# Patient Record
Sex: Female | Born: 1980
Health system: Southern US, Community
[De-identification: ages and names within clinical notes are randomized; demographics above are authoritative.]

## PROBLEM LIST (undated history)

## (undated) DIAGNOSIS — K644 Residual hemorrhoidal skin tags: Secondary | ICD-10-CM

## (undated) DIAGNOSIS — E669 Obesity, unspecified: Secondary | ICD-10-CM

## (undated) HISTORY — DX: Residual hemorrhoidal skin tags: K64.4

## (undated) HISTORY — PX: TUBAL LIGATION: SHX77

## (undated) HISTORY — DX: Obesity, unspecified: E66.9

---
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
## 2002-02-10 ENCOUNTER — Inpatient Hospital Stay (HOSPITAL_COMMUNITY): Admission: AD | Admit: 2002-02-10 | Discharge: 2002-02-10 | Payer: Self-pay | Admitting: Obstetrics and Gynecology

## 2002-04-06 ENCOUNTER — Other Ambulatory Visit: Admission: RE | Admit: 2002-04-06 | Discharge: 2002-04-06 | Payer: Self-pay | Admitting: Obstetrics and Gynecology

## 2002-08-09 ENCOUNTER — Other Ambulatory Visit: Admission: RE | Admit: 2002-08-09 | Discharge: 2002-08-09 | Payer: Self-pay | Admitting: Obstetrics and Gynecology

## 2002-08-11 ENCOUNTER — Inpatient Hospital Stay (HOSPITAL_COMMUNITY): Admission: AD | Admit: 2002-08-11 | Discharge: 2002-08-11 | Payer: Self-pay | Admitting: Obstetrics and Gynecology

## 2002-08-30 ENCOUNTER — Inpatient Hospital Stay (HOSPITAL_COMMUNITY): Admission: AD | Admit: 2002-08-30 | Discharge: 2002-09-01 | Payer: Self-pay | Admitting: Obstetrics and Gynecology

## 2002-09-07 ENCOUNTER — Inpatient Hospital Stay (HOSPITAL_COMMUNITY): Admission: AD | Admit: 2002-09-07 | Discharge: 2002-09-07 | Payer: Self-pay | Admitting: Obstetrics and Gynecology

## 2002-09-13 ENCOUNTER — Other Ambulatory Visit: Admission: RE | Admit: 2002-09-13 | Discharge: 2002-09-13 | Payer: Self-pay | Admitting: Obstetrics and Gynecology

## 2002-10-24 ENCOUNTER — Other Ambulatory Visit: Admission: RE | Admit: 2002-10-24 | Discharge: 2002-10-24 | Payer: Self-pay | Admitting: Obstetrics and Gynecology

## 2002-10-25 ENCOUNTER — Emergency Department (HOSPITAL_COMMUNITY): Admission: EM | Admit: 2002-10-25 | Discharge: 2002-10-25 | Payer: Self-pay | Admitting: Emergency Medicine

## 2003-06-16 ENCOUNTER — Emergency Department (HOSPITAL_COMMUNITY): Admission: AD | Admit: 2003-06-16 | Discharge: 2003-06-16 | Payer: Self-pay | Admitting: Family Medicine

## 2003-10-05 ENCOUNTER — Emergency Department (HOSPITAL_COMMUNITY): Admission: EM | Admit: 2003-10-05 | Discharge: 2003-10-05 | Payer: Self-pay | Admitting: Emergency Medicine

## 2004-04-10 ENCOUNTER — Emergency Department (HOSPITAL_COMMUNITY): Admission: EM | Admit: 2004-04-10 | Discharge: 2004-04-10 | Payer: Self-pay | Admitting: Emergency Medicine

## 2005-01-22 ENCOUNTER — Other Ambulatory Visit: Admission: RE | Admit: 2005-01-22 | Discharge: 2005-01-22 | Payer: Self-pay | Admitting: Obstetrics and Gynecology

## 2005-04-04 ENCOUNTER — Emergency Department (HOSPITAL_COMMUNITY): Admission: AD | Admit: 2005-04-04 | Discharge: 2005-04-04 | Payer: Self-pay | Admitting: Emergency Medicine

## 2005-05-18 ENCOUNTER — Inpatient Hospital Stay (HOSPITAL_COMMUNITY): Admission: AD | Admit: 2005-05-18 | Discharge: 2005-05-19 | Payer: Self-pay | Admitting: Obstetrics and Gynecology

## 2005-10-06 ENCOUNTER — Ambulatory Visit: Payer: Self-pay | Admitting: Family Medicine

## 2005-12-02 ENCOUNTER — Ambulatory Visit: Payer: Self-pay | Admitting: Family Medicine

## 2005-12-03 ENCOUNTER — Other Ambulatory Visit: Admission: RE | Admit: 2005-12-03 | Discharge: 2005-12-03 | Payer: Self-pay | Admitting: Obstetrics and Gynecology

## 2006-02-20 ENCOUNTER — Emergency Department (HOSPITAL_COMMUNITY): Admission: EM | Admit: 2006-02-20 | Discharge: 2006-02-20 | Payer: Self-pay | Admitting: Emergency Medicine

## 2006-04-04 ENCOUNTER — Ambulatory Visit: Payer: Self-pay | Admitting: Family Medicine

## 2006-04-04 ENCOUNTER — Encounter: Admission: RE | Admit: 2006-04-04 | Discharge: 2006-04-04 | Payer: Self-pay | Admitting: Family Medicine

## 2006-06-19 ENCOUNTER — Emergency Department (HOSPITAL_COMMUNITY): Admission: EM | Admit: 2006-06-19 | Discharge: 2006-06-19 | Payer: Self-pay | Admitting: Family Medicine

## 2006-06-30 ENCOUNTER — Ambulatory Visit: Payer: Self-pay | Admitting: Family Medicine

## 2006-07-01 ENCOUNTER — Ambulatory Visit: Payer: Self-pay | Admitting: Family Medicine

## 2006-08-15 ENCOUNTER — Ambulatory Visit: Payer: Self-pay | Admitting: Family Medicine

## 2006-08-19 ENCOUNTER — Ambulatory Visit: Payer: Self-pay | Admitting: Family Medicine

## 2007-02-15 ENCOUNTER — Ambulatory Visit: Payer: Self-pay | Admitting: Family Medicine

## 2007-04-20 ENCOUNTER — Emergency Department (HOSPITAL_COMMUNITY): Admission: EM | Admit: 2007-04-20 | Discharge: 2007-04-20 | Payer: Self-pay | Admitting: Family Medicine

## 2007-08-17 ENCOUNTER — Emergency Department (HOSPITAL_COMMUNITY): Admission: EM | Admit: 2007-08-17 | Discharge: 2007-08-17 | Payer: Self-pay | Admitting: Emergency Medicine

## 2007-11-21 ENCOUNTER — Ambulatory Visit: Payer: Self-pay | Admitting: Family Medicine

## 2008-01-28 ENCOUNTER — Emergency Department (HOSPITAL_COMMUNITY): Admission: EM | Admit: 2008-01-28 | Discharge: 2008-01-28 | Payer: Self-pay | Admitting: Emergency Medicine

## 2008-07-10 ENCOUNTER — Ambulatory Visit: Payer: Self-pay | Admitting: Family Medicine

## 2008-08-19 ENCOUNTER — Ambulatory Visit: Payer: Self-pay | Admitting: Family Medicine

## 2008-10-22 ENCOUNTER — Inpatient Hospital Stay (HOSPITAL_COMMUNITY): Admission: AD | Admit: 2008-10-22 | Discharge: 2008-10-22 | Payer: Self-pay | Admitting: Obstetrics and Gynecology

## 2009-02-13 ENCOUNTER — Inpatient Hospital Stay (HOSPITAL_COMMUNITY): Admission: AD | Admit: 2009-02-13 | Discharge: 2009-02-14 | Payer: Self-pay | Admitting: Obstetrics and Gynecology

## 2009-02-26 ENCOUNTER — Inpatient Hospital Stay (HOSPITAL_COMMUNITY): Admission: AD | Admit: 2009-02-26 | Discharge: 2009-02-26 | Payer: Self-pay | Admitting: Obstetrics & Gynecology

## 2009-02-27 ENCOUNTER — Inpatient Hospital Stay (HOSPITAL_COMMUNITY): Admission: AD | Admit: 2009-02-27 | Discharge: 2009-02-27 | Payer: Self-pay | Admitting: Obstetrics and Gynecology

## 2009-04-09 ENCOUNTER — Inpatient Hospital Stay (HOSPITAL_COMMUNITY): Admission: AD | Admit: 2009-04-09 | Discharge: 2009-04-09 | Payer: Self-pay | Admitting: Obstetrics and Gynecology

## 2009-04-21 ENCOUNTER — Inpatient Hospital Stay (HOSPITAL_COMMUNITY): Admission: AD | Admit: 2009-04-21 | Discharge: 2009-04-21 | Payer: Self-pay | Admitting: Obstetrics and Gynecology

## 2009-05-01 ENCOUNTER — Inpatient Hospital Stay (HOSPITAL_COMMUNITY): Admission: AD | Admit: 2009-05-01 | Discharge: 2009-05-02 | Payer: Self-pay | Admitting: Obstetrics and Gynecology

## 2009-05-02 ENCOUNTER — Encounter (INDEPENDENT_AMBULATORY_CARE_PROVIDER_SITE_OTHER): Payer: Self-pay | Admitting: Obstetrics and Gynecology

## 2009-05-02 ENCOUNTER — Inpatient Hospital Stay (HOSPITAL_COMMUNITY): Admission: AD | Admit: 2009-05-02 | Discharge: 2009-05-04 | Payer: Self-pay | Admitting: Obstetrics and Gynecology

## 2009-05-03 ENCOUNTER — Encounter (INDEPENDENT_AMBULATORY_CARE_PROVIDER_SITE_OTHER): Payer: Self-pay | Admitting: Obstetrics and Gynecology

## 2010-06-30 ENCOUNTER — Encounter: Payer: Self-pay | Admitting: Family Medicine

## 2010-07-19 LAB — CBC
HCT: 31 % — ABNORMAL LOW (ref 36.0–46.0)
Hemoglobin: 10.6 g/dL — ABNORMAL LOW (ref 12.0–15.0)
MCHC: 33.6 g/dL (ref 30.0–36.0)
Platelets: 203 10*3/uL (ref 150–400)
Platelets: 207 10*3/uL (ref 150–400)
RBC: 3.46 MIL/uL — ABNORMAL LOW (ref 3.87–5.11)
RDW: 13.4 % (ref 11.5–15.5)
RDW: 13.8 % (ref 11.5–15.5)
WBC: 15.7 10*3/uL — ABNORMAL HIGH (ref 4.0–10.5)

## 2010-07-31 ENCOUNTER — Encounter (INDEPENDENT_AMBULATORY_CARE_PROVIDER_SITE_OTHER): Payer: BC Managed Care – PPO | Admitting: Family Medicine

## 2010-07-31 DIAGNOSIS — R51 Headache: Secondary | ICD-10-CM

## 2010-07-31 DIAGNOSIS — Z0000 Encounter for general adult medical examination without abnormal findings: Secondary | ICD-10-CM

## 2010-07-31 DIAGNOSIS — J309 Allergic rhinitis, unspecified: Secondary | ICD-10-CM

## 2010-08-03 LAB — RPR: RPR Ser Ql: NONREACTIVE

## 2010-08-03 LAB — CBC
HCT: 37.2 % (ref 36.0–46.0)
MCHC: 33.3 g/dL (ref 30.0–36.0)
Platelets: 225 10*3/uL (ref 150–400)
RBC: 4.13 MIL/uL (ref 3.87–5.11)

## 2010-08-03 LAB — WET PREP, GENITAL
Trich, Wet Prep: NONE SEEN
Yeast Wet Prep HPF POC: NONE SEEN

## 2010-08-03 LAB — GC/CHLAMYDIA PROBE AMP, GENITAL
Chlamydia, DNA Probe: NEGATIVE
GC Probe Amp, Genital: NEGATIVE

## 2010-08-04 LAB — URINE MICROSCOPIC-ADD ON

## 2010-08-04 LAB — URINALYSIS, ROUTINE W REFLEX MICROSCOPIC
Hgb urine dipstick: NEGATIVE
Nitrite: NEGATIVE
Protein, ur: NEGATIVE mg/dL
Specific Gravity, Urine: 1.01 (ref 1.005–1.030)
Urobilinogen, UA: 0.2 mg/dL (ref 0.0–1.0)

## 2010-08-04 LAB — STREP B DNA PROBE

## 2010-08-04 LAB — URINE CULTURE: Colony Count: 50000

## 2010-08-06 LAB — URINE CULTURE

## 2010-08-06 LAB — URINALYSIS, ROUTINE W REFLEX MICROSCOPIC
Bilirubin Urine: NEGATIVE
Hgb urine dipstick: NEGATIVE
Ketones, ur: 15 mg/dL — AB
Nitrite: NEGATIVE
Protein, ur: NEGATIVE mg/dL
Specific Gravity, Urine: 1.015 (ref 1.005–1.030)
Urobilinogen, UA: 0.2 mg/dL (ref 0.0–1.0)

## 2010-08-06 LAB — URINE MICROSCOPIC-ADD ON

## 2010-08-06 LAB — WET PREP, GENITAL: Trich, Wet Prep: NONE SEEN

## 2010-08-06 LAB — GC/CHLAMYDIA PROBE AMP, GENITAL
Chlamydia, DNA Probe: NEGATIVE
GC Probe Amp, Genital: NEGATIVE

## 2010-08-10 LAB — URINE CULTURE: Colony Count: NO GROWTH

## 2010-08-10 LAB — URINALYSIS, ROUTINE W REFLEX MICROSCOPIC
Bilirubin Urine: NEGATIVE
Glucose, UA: NEGATIVE mg/dL
Ketones, ur: NEGATIVE mg/dL
Urobilinogen, UA: 0.2 mg/dL (ref 0.0–1.0)

## 2010-08-10 LAB — WET PREP, GENITAL
Trich, Wet Prep: NONE SEEN
Yeast Wet Prep HPF POC: NONE SEEN

## 2010-08-10 LAB — GC/CHLAMYDIA PROBE AMP, GENITAL: Chlamydia, DNA Probe: NEGATIVE

## 2010-08-10 LAB — URINE MICROSCOPIC-ADD ON

## 2010-09-18 NOTE — H&P (Signed)
NAME:  Denise Pitts, Denise Pitts                         ACCOUNT NO.:  [id number]   MEDICAL RECORD NO.:  [id number]                   PATIENT TYPE:  MAT   LOCATION:  [facility]                                 FACILITY:  [facility]   PHYSICIAN:  Crist Fat. Rivard, M.D.              DATE OF BIRTH:  November 16, 1980   DATE OF ADMISSION:  08/30/2002  DATE OF DISCHARGE:                                HISTORY & PHYSICAL   REASON FOR ADMISSION:  Intrauterine pregnancy at 36 weeks with spontaneous  rupture of membranes.   HISTORY OF PRESENT ILLNESS:  This is a 30 year old, single, black female,  gravida 2, para 1, with an estimated date of delivery of Sep 27, 2002,  currently at 36 weeks, who experienced spontaneous rupture of membranes at  11:30 a.m. this morning, clear abundant fluid.  She is not perceiving any  contractions at this point.  Reports good fetal activity.  Denies any  symptoms of pregnancy-induced hypertension.  Denies any vaginal bleeding.   Her prenatal course at [facility] revealed blood work with  blood A positive, sickle cell trait negative, RPR nonreactive, rubella  immune, HBSAG negative, and HIV nonreactive.  Pap smear was abnormal prior  to entering prenatal care for which the patient had a colposcopy in December  of 2003, revealing a normal Pap and an equivocal HPV.  No biopsies were  taken.  The patient is scheduled for a postpartum colposcopy.  Gonorrhea was  negative.  Chlamydia was negative.  The second trimester AFP and free beta  hCG was within normal limits.  The one-hour glucose tolerance test was at  99.  Sonogram in the second trimester was confirming dates with a normal  anatomy and a normal placenta.   ALLERGIES:  No known drug allergies.   PAST MEDICAL HISTORY:  In May of 2003, spontaneous vaginal delivery of a  little girl at 37 weeks without complications.   SOCIAL HISTORY:  Single.  Nonsmoker.  She is a Consulting civil engineer.   PHYSICAL EXAMINATION:  VITAL SIGNS:   Normal.  HEENT:  Negative.  LUNGS:  Clear.  HEART:  Normal.  ABDOMEN:  Gravida and nontender.  Fundal height appropriate for dates.  Sonogram revealing vertex presentation.  PELVIC:  3 cm, 50% effaced, vertex -1.  Clear abundant fluid.  EXTREMITIES:  Negative.   ASSESSMENT:  Intrauterine pregnancy at 36 weeks with spontaneous rupture of  membranes and group B Streptococcus positive on her 35-week visit.   PLAN:  The patient will be admitted.  We will start penicillin protocol, as  well as Pitocin augmentation.  Spontaneous vaginal delivery is expected.                                              Crist Fat Rivard, M.D.   SAR/MEDQ  D:  08/30/2002  T:  08/30/2002  Job:  [id number]
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
## 2010-09-21 ENCOUNTER — Inpatient Hospital Stay (INDEPENDENT_AMBULATORY_CARE_PROVIDER_SITE_OTHER)
Admission: RE | Admit: 2010-09-21 | Discharge: 2010-09-21 | Disposition: A | Discharge status: Home / Self Care | Payer: BC Managed Care – PPO | Source: Ambulatory Visit | Attending: Emergency Medicine | Admitting: Emergency Medicine

## 2010-09-21 DIAGNOSIS — L259 Unspecified contact dermatitis, unspecified cause: Secondary | ICD-10-CM

## 2010-10-01 ENCOUNTER — Ambulatory Visit (INDEPENDENT_AMBULATORY_CARE_PROVIDER_SITE_OTHER): Payer: BC Managed Care – PPO | Admitting: Medical

## 2010-10-01 ENCOUNTER — Encounter: Payer: Self-pay | Admitting: Medical

## 2010-10-01 VITALS — BP 110/64 | HR 62 | Temp 98.4°F | Ht 62.0 in | Wt 168.5 lb

## 2010-10-01 DIAGNOSIS — K644 Residual hemorrhoidal skin tags: Secondary | ICD-10-CM

## 2010-10-01 MED ORDER — HYDROCORTISONE ACETATE 25 MG RE SUPP: 25.0000 mg | Freq: Two times a day (BID) | RECTAL | Status: AC

## 2010-10-01 NOTE — Progress Notes (Signed)
Subjective:     Denise Pitts is a 30 y.o. female who presents for evaluation of possible hemorrhoids. Onset of symptoms was gradual. Symptoms have been gradually worsening since that time. Symptoms include: anorectal itching and painful defecation. Patient denies blood in stool . Treatment to date has been sitz baths. She does note 2 prior episodes of external hemorrhoids, once when she was pregnant, one other time as well. Both times the hemorrhoid cleared up with Anusol suppositories. She denies constipation, has a bowel movement every other day since she was a teenager, no other complaint.  The following portions of the patient's history were reviewed and updated as appropriate: allergies, current medications, past family history, past medical history, past social history, past surgical history and problem list.  Review of Systems A comprehensive review of systems was negative.    Objective:    BP 110/64  Pulse 62  Temp(Src) 98.4 F (36.9 C) (Oral)  Ht 5\' 2"  (1.575 m)  Wt 168 lb 8 oz (76.431 kg)  BMI 30.82 kg/m2  LMP 09/29/2010  Gen.: Well-developed, well-nourished, African American female, overweight Abdomen: Positive bowel sounds, nontender, nondistended, no hepatosplenomegaly, no masses Rectum: 1 large right external hemorrhoid, mildly tender, non-thrombosed otherwise anus normal appearing, no redness or fluctuance, exam chaperoned by nurse   Assessment:    External hemorrhoid   Plan:    Discussed the diagnosis with the patient, including plans for treatment and expected course. Tour manager. Discussed symptomatic and preventative measures regarding hemorrhoidal disease. Recommended fiber supplementation. Anusol suppositories as needed.  Short term use only. Follow up as needed.

## 2010-10-01 NOTE — Patient Instructions (Addendum)
Hemorrhoids Hemorrhoids are dilated (enlarged) veins around the rectum. Sometimes clots will form in the veins. This makes them swollen and painful. These are called thrombosed hemorrhoids. Causes of hemorrhoids include:  Pregnancy: this increases the pressure in the hemorrhoidal veins.   Constipation.   Straining to have a bowel movement.  HOME CARE INSTRUCTIONS  Eat a well balanced diet and drink 6 to 8 glasses of water every day to avoid constipation. You may also use a bulk laxative.   Increase your fiber intake.  Consider taking Fibercon or Miralax powder supplement OTC daily.  Avoid straining to have bowel movements.   Keep anal area dry and clean.   Only take over-the-counter or prescription medicines for pain, discomfort, or fever as directed by your caregiver.  If thrombosed:  Take hot sitz baths for 20 to 30 minutes, 3 to 4 times per day.   If the hemorrhoids are very tender and swollen, place ice packs on area as tolerated. Using ice packs between sitz baths may be helpful. Fill a plastic bag with ice and use a towel between the bag of ice and your skin.   Special creams and suppositories (Anusol, Nupercainal, Wyanoids) may be used or applied as directed.   Do not use a donut shaped pillow or sit on the toilet for long periods. This increases blood pooling and pain.   Move your bowels when your body has the urge; this will require less straining and will decrease pain and pressure.   Only take over-the-counter or prescription medicines for pain, discomfort, or fever as directed by your caregiver.  SEEK MEDICAL CARE IF:  You have increasing pain and swelling that is not controlled with your prescription.   You have uncontrolled bleeding.   You have an inability or difficulty having a bowel movement.   You have pain or inflammation outside the area of the hemorrhoids.   You have chills and/or an oral temperature above 101 that lasts for 2 days or longer, or as your  caregiver suggests.  MAKE SURE YOU:   Understand these instructions.   Will watch your condition.   Will get help right away if you are not doing well or get worse.  Document Released: 04/16/2000 Document Re-Released: 04/01/2008 [facility] Patient Information 2011 [place], [place].

## 2010-11-19 ENCOUNTER — Encounter: Payer: Self-pay | Admitting: Family Medicine

## 2011-01-26 LAB — RAPID STREP SCREEN (MED CTR MEBANE ONLY): Streptococcus, Group A Screen (Direct): POSITIVE — AB

## 2011-09-23 ENCOUNTER — Telehealth: Payer: Self-pay | Admitting: Obstetrics and Gynecology

## 2011-09-23 NOTE — Telephone Encounter (Signed)
Tc TO PT.  STates is having milky vag D/C.  No odor.  No itching.   Used Boric Acid Supp x 12 weeks with improvement.  D/C'd.  When sx recurred again used but no improvement.   Sched for eval w/SL 09/24/11.

## 2011-09-23 NOTE — Telephone Encounter (Signed)
Triage/epic 

## 2011-09-24 ENCOUNTER — Ambulatory Visit (INDEPENDENT_AMBULATORY_CARE_PROVIDER_SITE_OTHER): Payer: BC Managed Care – PPO | Admitting: Obstetrics and Gynecology

## 2011-09-24 ENCOUNTER — Encounter: Payer: Self-pay | Admitting: Obstetrics and Gynecology

## 2011-09-24 VITALS — BP 100/62 | Resp 16 | Wt 180.0 lb

## 2011-09-24 DIAGNOSIS — B9689 Other specified bacterial agents as the cause of diseases classified elsewhere: Secondary | ICD-10-CM

## 2011-09-24 DIAGNOSIS — A499 Bacterial infection, unspecified: Secondary | ICD-10-CM

## 2011-09-24 DIAGNOSIS — N760 Acute vaginitis: Secondary | ICD-10-CM

## 2011-09-24 MED ORDER — FLORAJEN3 PO CAPS: 1.0000 | ORAL_CAPSULE | Freq: Every day | ORAL | Status: DC

## 2011-09-24 MED ORDER — METRONIDAZOLE 500 MG PO TABS: 500.0000 mg | ORAL_TABLET | Freq: Two times a day (BID) | ORAL | Status: AC

## 2011-09-24 NOTE — Progress Notes (Signed)
Vaginal Discharge/Discomfort/Itching  Subjective:   Denise Pitts is an 31 y.o. woman who presents c/o vaginal discharge, white thick x1wk  Objective: discharge white non-adherent, lesions none, CMT neg, tenderness neg and masses none Vaginal discharge: color white, consistency thick mucoid, odor KOH-whiff test positive, wet mount clue cells Vaginal lesions:  none   Assessment: BV   Plan: Additional Tests:none indicated  Medications: flagyl 500mg  BID x7days, pt requested PO tx Rec probiotic, RX floragen,  Rec restarting boric acid, BID x7days then 2x/weekly  Follow-up: prn  Sharlie Shreffler M CNM 09/24/2011 5:25 PM

## 2011-09-24 NOTE — Progress Notes (Signed)
Vaginal discharge: whitethick mucoid Itching / Burning: no Fever: no  Symptoms have been present for 1 week. Has used over-the-counter treatment: no Associated symptoms:  Pelvic pain: no       Dyspareunia: no     Odor:  yes  History of STD:  no history of PID, STD's STD screen:declined  Pt states she has recurrent BV before her menses  Pt has Boric Acid at home stopped taking it 2 wks ago Needs Vit D refill

## 2012-12-31 ENCOUNTER — Encounter (HOSPITAL_COMMUNITY): Payer: Self-pay | Admitting: *Deleted

## 2012-12-31 ENCOUNTER — Inpatient Hospital Stay (HOSPITAL_COMMUNITY)
Admission: AD | Admit: 2012-12-31 | Discharge: 2012-12-31 | Disposition: A | Discharge status: Home / Self Care | Payer: No Typology Code available for payment source | Source: Ambulatory Visit | Attending: Obstetrics and Gynecology | Admitting: Obstetrics and Gynecology

## 2012-12-31 DIAGNOSIS — N949 Unspecified condition associated with female genital organs and menstrual cycle: Secondary | ICD-10-CM | POA: Insufficient documentation

## 2012-12-31 DIAGNOSIS — N760 Acute vaginitis: Secondary | ICD-10-CM | POA: Insufficient documentation

## 2012-12-31 DIAGNOSIS — B9689 Other specified bacterial agents as the cause of diseases classified elsewhere: Secondary | ICD-10-CM | POA: Insufficient documentation

## 2012-12-31 DIAGNOSIS — A499 Bacterial infection, unspecified: Secondary | ICD-10-CM | POA: Insufficient documentation

## 2012-12-31 LAB — URINALYSIS, ROUTINE W REFLEX MICROSCOPIC
Bilirubin Urine: NEGATIVE
Hgb urine dipstick: NEGATIVE
Nitrite: NEGATIVE
Protein, ur: NEGATIVE mg/dL
Urobilinogen, UA: 0.2 mg/dL (ref 0.0–1.0)

## 2012-12-31 LAB — URINE MICROSCOPIC-ADD ON

## 2012-12-31 LAB — WET PREP, GENITAL
Trich, Wet Prep: NONE SEEN
Yeast Wet Prep HPF POC: NONE SEEN

## 2012-12-31 MED ORDER — METRONIDAZOLE 500 MG PO TABS: 500.0000 mg | ORAL_TABLET | Freq: Two times a day (BID) | ORAL | Status: AC

## 2012-12-31 NOTE — MAU Provider Note (Signed)
History   32 yo [id number] presented unannounced c/o vaginal d/c and irritation x 3 days--started after using new soap.  Also reports hx of recurrent BV, sometimes occurring prior to cycle and with IC.  Denies STD risk, but agreeable to [facility], chlamydia testing.  Denies pregnancy risk--hx tubal ligation  Problem list: Hx recurrent BV Hx BTL 2010 Hx previous PTD   Chief Complaint  Patient presents with  . Vaginal Itching    OB History   Grav Para Term Preterm Abortions TAB SAB Ect Mult Living   4 3 3  1     3       Past Medical History  Diagnosis Date  . External hemorrhoid   . Obesity     Past Surgical History  Procedure Laterality Date  . Tubal ligation      History reviewed. No pertinent family history.  History  Substance Use Topics  . Smoking status: Never Smoker   . Smokeless tobacco: Never Used  . Alcohol Use: No     Comment: 2 drinks in a month    Allergies: No Known Allergies  Prescriptions prior to admission  Medication Sig Dispense Refill  . miconazole (MICOTIN) 2 % cream Apply topically 2 (two) times daily.      . predniSONE (DELTASONE) 10 MG tablet Take 10 mg by mouth daily.        . Probiotic Product [facility]) CAPS Take 1 capsule by mouth daily.  30 capsule  11     Physical Exam   Blood pressure 114/81, pulse 65, temperature 98.2 F (36.8 C), temperature source Oral, resp. rate 18, height 5\' 2"  (1.575 m), weight 79.833 kg (176 lb), last menstrual period 11/03/2012.  Chest clear Heart RRR without murmur Abd soft, NT Pelvic--thin yellow d/c in vault, no CMT.  Uterus NT, adnexa WNL, no masses No lesion or erythema noted in vagina or on vulva. Ext WNL  ED Course  Probable BV  Plan: UPT Wet prep GC, chlamydia with consent   Laina Guerrieri CNM, [place] 12/31/2012 7:00 AM  Addendum:  Results for orders placed during the hospital encounter of 12/31/12 (from the past 24 hour(s))  URINALYSIS, ROUTINE W REFLEX MICROSCOPIC     Status: Abnormal   Collection Time    12/31/12  5:51 AM      Result Value Range   Color, Urine YELLOW  YELLOW   APPearance CLEAR  CLEAR   Specific Gravity, Urine >1.030 (*) 1.005 - 1.030   pH 6.0  5.0 - 8.0   Glucose, UA NEGATIVE  NEGATIVE mg/dL   Hgb urine dipstick NEGATIVE  NEGATIVE   Bilirubin Urine NEGATIVE  NEGATIVE   Ketones, ur NEGATIVE  NEGATIVE mg/dL   Protein, ur NEGATIVE  NEGATIVE mg/dL   Urobilinogen, UA 0.2  0.0 - 1.0 mg/dL   Nitrite NEGATIVE  NEGATIVE   Leukocytes, UA MODERATE (*) NEGATIVE  URINE MICROSCOPIC-ADD ON     Status: Abnormal   Collection Time    12/31/12  5:51 AM      Result Value Range   Squamous Epithelial / LPF FEW (*) RARE   WBC, UA 3-6  <3 WBC/hpf   Urine-Other MUCOUS PRESENT    POCT PREGNANCY, URINE     Status: None   Collection Time    12/31/12  6:03 AM      Result Value Range   Preg Test, Ur NEGATIVE  NEGATIVE  WET PREP, GENITAL     Status: Abnormal   Collection Time  12/31/12  6:44 AM      Result Value Range   Yeast Wet Prep HPF POC NONE SEEN  NONE SEEN   Trich, Wet Prep NONE SEEN  NONE SEEN   Clue Cells Wet Prep HPF POC MODERATE (*) NONE SEEN   WBC, Wet Prep HPF POC MANY (*) NONE SEEN   Impression: BV  Plan: D/C home Rx Metronidazole 500 mg po BID x 7 days. F/U with [facility] prn.  Nigel Bridgeman, CNM 12/31/12 8a

## 2012-12-31 NOTE — MAU Note (Signed)
Patient states she started using some new soap and it has caused vaginal irritation x 2 days ago.

## 2013-01-01 LAB — URINE CULTURE

## 2013-07-26 ENCOUNTER — Ambulatory Visit
Admission: RE | Admit: 2013-07-26 | Discharge: 2013-07-26 | Disposition: A | Discharge status: Home / Self Care | Payer: BC Managed Care – PPO | Source: Ambulatory Visit | Attending: Internal Medicine | Admitting: Internal Medicine

## 2013-07-26 ENCOUNTER — Other Ambulatory Visit: Payer: Self-pay | Admitting: Internal Medicine

## 2013-07-26 DIAGNOSIS — R609 Edema, unspecified: Secondary | ICD-10-CM

## 2014-03-04 ENCOUNTER — Encounter (HOSPITAL_COMMUNITY): Payer: Self-pay | Admitting: *Deleted

## 2015-07-03 ENCOUNTER — Encounter (HOSPITAL_COMMUNITY): Payer: Self-pay | Admitting: *Deleted

## 2015-07-03 ENCOUNTER — Inpatient Hospital Stay (HOSPITAL_COMMUNITY)
Admission: AD | Admit: 2015-07-03 | Discharge: 2015-07-03 | Disposition: A | Discharge status: Home / Self Care | Payer: BLUE CROSS/BLUE SHIELD | Source: Ambulatory Visit | Attending: Obstetrics and Gynecology | Admitting: Obstetrics and Gynecology

## 2015-07-03 DIAGNOSIS — R109 Unspecified abdominal pain: Secondary | ICD-10-CM | POA: Insufficient documentation

## 2015-07-03 DIAGNOSIS — N760 Acute vaginitis: Secondary | ICD-10-CM | POA: Diagnosis not present

## 2015-07-03 DIAGNOSIS — Z3202 Encounter for pregnancy test, result negative: Secondary | ICD-10-CM | POA: Insufficient documentation

## 2015-07-03 DIAGNOSIS — R1030 Lower abdominal pain, unspecified: Secondary | ICD-10-CM

## 2015-07-03 LAB — URINALYSIS, ROUTINE W REFLEX MICROSCOPIC
BILIRUBIN URINE: NEGATIVE
Glucose, UA: NEGATIVE mg/dL
Hgb urine dipstick: NEGATIVE
Ketones, ur: NEGATIVE mg/dL
Leukocytes, UA: NEGATIVE
NITRITE: NEGATIVE
PH: 6 (ref 5.0–8.0)
Protein, ur: NEGATIVE mg/dL

## 2015-07-03 LAB — WET PREP, GENITAL
SPERM: NONE SEEN
Trich, Wet Prep: NONE SEEN
YEAST WET PREP: NONE SEEN

## 2015-07-03 LAB — POCT PREGNANCY, URINE: Preg Test, Ur: NEGATIVE

## 2015-07-03 MED ORDER — METRONIDAZOLE 500 MG PO TABS: 500.0000 mg | ORAL_TABLET | Freq: Two times a day (BID) | ORAL | Status: DC

## 2015-07-03 NOTE — MAU Note (Signed)
PT  SAYS  HAS  LEFT   LOWER ABD  CRAMPING-  STARTED   ON Tuesday-   TOOK IBUPROFEN  2 TABS -  LAST TIME AT 12NOON TODAY.    BTL-  2010.   LAST SEX-  6 MTHS.     NO HPT.    NO FEVER.    NO V/D.  NO DIFF VOIDING  LAST SEEN AT [facility]  IN 08-2014.

## 2015-07-03 NOTE — MAU Provider Note (Signed)
Denise Pitts is a 35 y.o. G4P3 non pregnant w/BTL presented unannounced c/o lower abd cramping   History     There are no active problems to display for this patient.   Chief Complaint  Patient presents with  . Abdominal Pain   HPI  OB History    Gravida Para Term Preterm AB TAB SAB Ectopic Multiple Living   4 3 0 Past Medical History  Diagnosis Date  . External hemorrhoid   . Obesity     Past Surgical History  Procedure Laterality Date  . Tubal ligation      No family history on file.  Social History  Substance Use Topics  . Smoking status: Never Smoker   . Smokeless tobacco: Never Used  . Alcohol Use: No     Comment: 2 drinks in a month    Allergies: No Known Allergies  Prescriptions prior to admission  Medication Sig Dispense Refill Last Dose  . ibuprofen (ADVIL,MOTRIN) 200 MG tablet Take 400 mg by mouth every 6 (six) hours as needed for moderate pain.   07/03/2015 at Unknown time    ROS See HPI above, all other systems are negative  Physical Exam   Blood pressure 122/74, pulse 59, temperature 98.2 F (36.8 C), temperature source Oral, height  (1.575 m), weight 174 lb 2 oz (78.983 kg), last menstrual period 04/25/2015.  Physical Exam Ext:  WNL ABD: Soft, non tender to palpation, no rebound or guarding SVE:    ED Course  Assessment: Lower abd pain   Results for orders placed or performed during the hospital encounter of 07/03/15 (from the past 24 hour(s))  Urinalysis, Routine w reflex microscopic (not at [facility])     Status: Abnormal   Collection Time: 07/03/15  7:42 PM  Result Value Ref Range   Color, Urine YELLOW YELLOW   APPearance CLEAR CLEAR   Specific Gravity, Urine >1.030 (H) 1.005 - 1.030   pH 6.0 5.0 - 8.0   Glucose, UA NEGATIVE NEGATIVE mg/dL   Hgb urine dipstick NEGATIVE NEGATIVE   Bilirubin Urine NEGATIVE NEGATIVE   Ketones, ur NEGATIVE NEGATIVE mg/dL   Protein, ur NEGATIVE NEGATIVE mg/dL   Nitrite  NEGATIVE NEGATIVE   Leukocytes, UA NEGATIVE NEGATIVE  Pregnancy, urine POC     Status: None   Collection Time: 07/03/15  8:15 PM  Result Value Ref Range   Preg Test, Ur NEGATIVE NEGATIVE  Wet prep, genital     Status: Abnormal   Collection Time: 07/03/15  8:43 PM  Result Value Ref Range   Yeast Wet Prep HPF POC NONE SEEN NONE SEEN   Trich, Wet Prep NONE SEEN NONE SEEN   Clue Cells Wet Prep HPF POC PRESENT (A) NONE SEEN   WBC, Wet Prep HPF POC MODERATE (A) NONE SEEN   Sperm NONE SEEN    BV positive  Plan: -Rx for flagyl  -Discussed need to follow up in office  -Encouraged to call if any questions or concerns arise prior to next scheduled office visit.  -Discharged to home in stable condition    Naraya Stoneberg, CNM, MSN 07/03/2015. 9:49 PM

## 2015-07-03 NOTE — Discharge Instructions (Signed)

## 2015-07-04 LAB — GC/CHLAMYDIA PROBE AMP (~~LOC~~) NOT AT ARMC
CHLAMYDIA, DNA PROBE: NEGATIVE
NEISSERIA GONORRHEA: NEGATIVE

## 2016-07-16 ENCOUNTER — Ambulatory Visit (INDEPENDENT_AMBULATORY_CARE_PROVIDER_SITE_OTHER): Payer: BLUE CROSS/BLUE SHIELD | Admitting: Podiatry

## 2016-07-16 ENCOUNTER — Encounter: Payer: Self-pay | Admitting: Podiatry

## 2016-07-16 VITALS — Resp 16 | Ht 62.0 in | Wt 175.0 lb

## 2016-07-16 DIAGNOSIS — L309 Dermatitis, unspecified: Secondary | ICD-10-CM

## 2016-07-16 DIAGNOSIS — B351 Tinea unguium: Secondary | ICD-10-CM | POA: Diagnosis not present

## 2016-07-16 MED ORDER — TERBINAFINE HCL 250 MG PO TABS: 250.0000 mg | ORAL_TABLET | Freq: Every day | ORAL | 0 refills | Status: DC

## 2016-07-16 NOTE — Progress Notes (Signed)
   Subjective:    Patient ID: Denise Pitts, female    DOB: 05-06-1980, 36 y.o.   MRN: [id number]  HPI  Chief Complaint  Patient presents with  . Tinea Pedis    BL; Itchy dry skin between toes and bottom of foot. Right is worse than left. Pt has tried athletes foot creams and sprays, not effective.   . Skin Lesion    Right 5th toe; latral side. Thick dry skin. Denies pain.   . Nail Problem    Right; Great toe and 5th toe; Dark/Discolored.        Review of Systems     Objective:   Physical Exam        Assessment & Plan:

## 2016-07-18 NOTE — Progress Notes (Signed)
Subjective:     Patient ID: Chaney Bornequila S Scinto, female   DOB: [birth year], 36 y.o.   MRN: [id number]  HPI patient presents with excessively dry skin of the right over left foot with irritation and white between the lesser digits right foot. History of itching   Review of Systems  All other systems reviewed and are negative.      Objective:   Physical Exam  Constitutional: She is oriented to person, place, and time.  Cardiovascular: Intact distal pulses.   Musculoskeletal: Normal range of motion.  Neurological: She is oriented to person, place, and time.  Skin: Skin is warm and dry.  Nursing note and vitals reviewed.  Neurovascular status found to be intact muscle strength was adequate range of motion within normal limits with patient noted to have dry skin on the dorsum and plantar of the right foot and heel with irritation between the lesser digits right over left foot     Assessment:     Probability for dermatitis-like condition with fungal element present    Plan:     H&P and education rendered and advised on Vaseline twice a week with Saran wrap and socket usage. Or the other days she will utilize revitaderm cream. I also center for blood work at this time and placed her on Lamisil 1 per day for 60 days and explained risk. Reappoint 4 months to reevaluate

## 2016-12-24 ENCOUNTER — Ambulatory Visit: Payer: BLUE CROSS/BLUE SHIELD | Admitting: Podiatry

## 2019-03-28 ENCOUNTER — Other Ambulatory Visit: Payer: Self-pay

## 2019-03-28 DIAGNOSIS — Z20822 Contact with and (suspected) exposure to covid-19: Secondary | ICD-10-CM

## 2019-03-29 LAB — NOVEL CORONAVIRUS, NAA: SARS-CoV-2, NAA: NOT DETECTED

## 2020-01-18 ENCOUNTER — Ambulatory Visit: Payer: Self-pay

## 2020-03-12 ENCOUNTER — Other Ambulatory Visit: Payer: Self-pay

## 2020-03-12 ENCOUNTER — Ambulatory Visit (INDEPENDENT_AMBULATORY_CARE_PROVIDER_SITE_OTHER): Payer: Medicaid Other | Admitting: Obstetrics & Gynecology

## 2020-03-12 ENCOUNTER — Encounter: Payer: Self-pay | Admitting: Obstetrics & Gynecology

## 2020-03-12 ENCOUNTER — Other Ambulatory Visit (HOSPITAL_COMMUNITY)
Admission: RE | Admit: 2020-03-12 | Discharge: 2020-03-12 | Disposition: A | Discharge status: Home / Self Care | Payer: Medicaid Other | Source: Ambulatory Visit | Attending: Obstetrics & Gynecology | Admitting: Obstetrics & Gynecology

## 2020-03-12 VITALS — BP 147/87 | HR 63 | Ht 62.0 in | Wt 184.0 lb

## 2020-03-12 DIAGNOSIS — Z01419 Encounter for gynecological examination (general) (routine) without abnormal findings: Secondary | ICD-10-CM | POA: Insufficient documentation

## 2020-03-12 NOTE — Progress Notes (Signed)
Subjective:     Denise Pitts is a 39 y.o. female here for a routine exam.  She denies problems or concerns. She is sexually active- female. Normal monthly menses.      Gynecologic History Patient's last menstrual period was 03/02/2020. Contraception: tubal ligation Last Pap: 3 years prev. Results were: normal h/o abnormal Pap with pregnancy Last mammogram: n/a  Obstetric History OB History  Gravida Para Term Preterm AB Living  4 3 0 3 1 3   SAB TAB Ectopic Multiple Live Births    1     3    # Outcome Date GA Lbr Len/2nd Weight Sex Delivery Anes PTL Lv  4 Preterm 2010 [redacted]w[redacted]d    Vag-Spont   LIV  3 Preterm 2004 [redacted]w[redacted]d    Vag-Spont   LIV  2 Preterm 2003 [redacted]w[redacted]d    Vag-Spont   LIV  1 TAB            The following portions of the patient's history were reviewed and updated as appropriate: allergies, current medications, past family history, past medical history, past social history, past surgical history and problem list.  Review of Systems Pertinent items are noted in HPI.    Objective:  BP (!) 147/87    Pulse 63    Ht 5\' 2"  (1.575 m)    Wt 184 lb (83.5 kg)    LMP 03/02/2020    BMI 33.65 kg/m   General Appearance:    Alert, cooperative, no distress, appears stated age  Head:    Normocephalic, without obvious abnormality, atraumatic  Eyes:    conjunctiva/corneas clear, EOM's intact, both eyes  Ears:    Normal external ear canals, both ears  Nose:   Nares normal, septum midline, mucosa normal, no drainage    or sinus tenderness  Throat:   Lips, mucosa, and tongue normal; teeth and gums normal  Neck:   Supple, symmetrical, trachea midline, no adenopathy;    thyroid:  no enlargement/tenderness/nodules  Back:     Symmetric, no curvature, ROM normal, no CVA tenderness  Lungs:     respirations unlabored  Chest Wall:    No tenderness or deformity   Heart:    Regular rate and rhythm  Breast Exam:    No tenderness, masses, or nipple abnormality  Abdomen:     Soft, non-tender, bowel sounds  active all four quadrants,    no masses, no organomegaly  Genitalia:    Normal female without lesion, discharge or tenderness     Extremities:   Extremities normal, atraumatic, no cyanosis or edema  Pulses:   2+ and symmetric all extremities  Skin:   Skin color, texture, turgor normal, no rashes or lesions    Assessment:    Healthy female exam.    Plan:  Denise Pitts was seen today for gynecologic exam.  Diagnoses and all orders for this visit:  Well female exam with routine gynecological exam -     Cytology - PAP( Denise Pitts)  f/u in 1 year or sooner prn   Yuvaan Olander L. Harraway-Smith, M.D., 03/04/2020

## 2020-03-17 LAB — CYTOLOGY - PAP
Chlamydia: NEGATIVE
Comment: NEGATIVE
Comment: NEGATIVE
Comment: NORMAL
Diagnosis: NEGATIVE
Diagnosis: REACTIVE
High risk HPV: NEGATIVE
Neisseria Gonorrhea: NEGATIVE

## 2020-09-15 ENCOUNTER — Other Ambulatory Visit (HOSPITAL_COMMUNITY)
Admission: RE | Admit: 2020-09-15 | Discharge: 2020-09-15 | Disposition: A | Discharge status: Home / Self Care | Payer: Medicaid Other | Source: Ambulatory Visit | Attending: Family Medicine | Admitting: Family Medicine

## 2020-09-15 ENCOUNTER — Other Ambulatory Visit: Payer: Self-pay

## 2020-09-15 ENCOUNTER — Ambulatory Visit (INDEPENDENT_AMBULATORY_CARE_PROVIDER_SITE_OTHER): Payer: Medicaid Other

## 2020-09-15 VITALS — BP 127/81 | HR 85 | Wt 184.0 lb

## 2020-09-15 DIAGNOSIS — N898 Other specified noninflammatory disorders of vagina: Secondary | ICD-10-CM | POA: Diagnosis not present

## 2020-09-15 NOTE — Progress Notes (Signed)
Patient seen and assessed by nursing staff.  Agree with documentation and plan.  

## 2020-09-15 NOTE — Progress Notes (Signed)
Pt presents stating she with pink vaginal discharge x 1 day. Pt requests to be checked for STD's. Self swab was sent to the lab.  Malori Myers l Clarence Dunsmore, CMA

## 2020-09-16 LAB — CERVICOVAGINAL ANCILLARY ONLY
Bacterial Vaginitis (gardnerella): NEGATIVE
Candida Glabrata: NEGATIVE
Candida Vaginitis: NEGATIVE
Chlamydia: NEGATIVE
Comment: NEGATIVE
Comment: NEGATIVE
Comment: NEGATIVE
Comment: NEGATIVE
Comment: NEGATIVE
Comment: NORMAL
Neisseria Gonorrhea: NEGATIVE
Trichomonas: NEGATIVE

## 2020-10-01 ENCOUNTER — Other Ambulatory Visit: Payer: Self-pay | Admitting: Internal Medicine

## 2020-10-01 ENCOUNTER — Ambulatory Visit
Admission: RE | Admit: 2020-10-01 | Discharge: 2020-10-01 | Disposition: A | Discharge status: Home / Self Care | Payer: Medicaid Other | Source: Ambulatory Visit | Attending: Internal Medicine | Admitting: Internal Medicine

## 2020-10-01 DIAGNOSIS — R059 Cough, unspecified: Secondary | ICD-10-CM

## 2020-11-10 DIAGNOSIS — K219 Gastro-esophageal reflux disease without esophagitis: Secondary | ICD-10-CM | POA: Insufficient documentation

## 2020-11-10 DIAGNOSIS — R0989 Other specified symptoms and signs involving the circulatory and respiratory systems: Secondary | ICD-10-CM | POA: Insufficient documentation

## 2021-01-15 ENCOUNTER — Encounter: Payer: Self-pay | Admitting: General Practice

## 2021-03-16 ENCOUNTER — Ambulatory Visit
Admission: RE | Admit: 2021-03-16 | Discharge: 2021-03-16 | Disposition: A | Discharge status: Home / Self Care | Payer: Medicaid Other | Source: Ambulatory Visit | Attending: Internal Medicine | Admitting: Internal Medicine

## 2021-03-16 ENCOUNTER — Other Ambulatory Visit: Payer: Self-pay | Admitting: Internal Medicine

## 2021-03-16 DIAGNOSIS — R52 Pain, unspecified: Secondary | ICD-10-CM

## 2021-09-21 ENCOUNTER — Other Ambulatory Visit (HOSPITAL_COMMUNITY)
Admission: RE | Admit: 2021-09-21 | Discharge: 2021-09-21 | Disposition: A | Discharge status: Home / Self Care | Payer: Medicaid Other | Source: Ambulatory Visit | Attending: Obstetrics & Gynecology | Admitting: Obstetrics & Gynecology

## 2021-09-21 ENCOUNTER — Ambulatory Visit (INDEPENDENT_AMBULATORY_CARE_PROVIDER_SITE_OTHER): Payer: Medicaid Other | Admitting: Obstetrics & Gynecology

## 2021-09-21 ENCOUNTER — Encounter: Payer: Self-pay | Admitting: Obstetrics & Gynecology

## 2021-09-21 VITALS — BP 120/86 | HR 63 | Ht 62.0 in | Wt 182.0 lb

## 2021-09-21 DIAGNOSIS — Z113 Encounter for screening for infections with a predominantly sexual mode of transmission: Secondary | ICD-10-CM | POA: Insufficient documentation

## 2021-09-21 DIAGNOSIS — N760 Acute vaginitis: Secondary | ICD-10-CM

## 2021-09-21 NOTE — Progress Notes (Signed)
   GYNECOLOGY OFFICE VISIT NOTE  History:   Denise Pitts is a 41 y.o. [phone] here today for evaluation of vaginal itching and discharge since 09/16/21.  Her PCP called in Monistat which she said led to more irritation, then she used Diflucan on 5/17 and 5/20. Symptoms persisted and she used OTC boric acid vaginal capsules which helped. Still has some irritation and white discharge today, wants evaluation. Also wants to be tested for ancillary STIs from swab, declines serum testing. Reports history of recurrent vaginitis. She denies any abnormal vaginal bleeding, pelvic pain or other concerns.    Past Medical History:  Diagnosis Date   External hemorrhoid    Obesity     Past Surgical History:  Procedure Laterality Date   TUBAL LIGATION      The following portions of the patient's history were reviewed and updated as appropriate: allergies, current medications, past family history, past medical history, past social history, past surgical history and problem list.   Health Maintenance:  Normal pap and negative HRHPV on 03/12/2020.    Review of Systems:  Pertinent items noted in HPI and remainder of comprehensive ROS otherwise negative.  Physical Exam:  BP 120/86   Pulse 63   Ht 5\' 2"  (1.575 m)   Wt 182 lb (82.6 kg)   LMP 08/26/2021 (Within Days)   BMI 33.29 kg/m  CONSTITUTIONAL: Well-developed, well-nourished female in no acute distress.  HEENT:  Normocephalic, atraumatic. External right and left ear normal. No scleral icterus.  NECK: Normal range of motion, supple, no masses noted on observation SKIN: No rash noted. Not diaphoretic. No erythema. No pallor. MUSCULOSKELETAL: Normal range of motion. No edema noted. NEUROLOGIC: Alert and oriented to person, place, and time. Normal muscle tone coordination. No cranial nerve deficit noted. PSYCHIATRIC: Normal mood and affect. Normal behavior. Normal judgment and thought content. CARDIOVASCULAR: Normal heart rate noted RESPIRATORY:  Effort and breath sounds normal, no problems with respiration noted ABDOMEN: No masses noted. No other overt distention noted.   PELVIC: Normal appearing external genitalia; normal urethral meatus; normal appearing vaginal mucosa and cervix.  Thin, white discharge noted, testing sample obtained.  Normal uterine size, no other palpable masses, no uterine or adnexal tenderness. Performed in the presence of a chaperone    Assessment and Plan:     1. Vulvovaginitis 2. Routine screening for STI (sexually transmitted infection) - Cervicovaginal ancillary only( [place]) done, will follow up results and manage accordingly. Depending on results of the swab, may need prolonged vaginal metronidazole vs boric acid therapy. Proper vulvar hygiene emphasized: discussed avoidance of perfumed soaps, detergents, lotions and any type of douches; in addition to wearing cotton underwear and no underwear at night.  Also recommended cleaning front to back, voiding and cleaning up after intercourse.   Routine preventative health maintenance measures emphasized. Please refer to After Visit Summary for other counseling recommendations.   Return for any gynecologic concerns.    I spent 20 minutes dedicated to the care of this patient including pre-visit review of records, face to face time with the patient discussing her conditions and treatments and post visit orders.    Verita Schneiders, MD, [place] [facility] [employer], [place]

## 2021-09-22 LAB — CERVICOVAGINAL ANCILLARY ONLY
Bacterial Vaginitis (gardnerella): POSITIVE — AB
Candida Glabrata: NEGATIVE
Candida Vaginitis: NEGATIVE
Chlamydia: NEGATIVE
Comment: NEGATIVE
Comment: NEGATIVE
Comment: NEGATIVE
Comment: NEGATIVE
Comment: NEGATIVE
Comment: NORMAL
Neisseria Gonorrhea: NEGATIVE
Trichomonas: NEGATIVE

## 2021-09-22 MED ORDER — METRONIDAZOLE 0.75 % VA GEL: 1.0000 | Freq: Every day | VAGINAL | 5 refills | Status: DC

## 2021-09-22 NOTE — Addendum Note (Signed)
Addended by: Jaynie Collins A on: 09/22/2021 02:30 PM   Modules accepted: Orders

## 2021-10-02 ENCOUNTER — Emergency Department (HOSPITAL_BASED_OUTPATIENT_CLINIC_OR_DEPARTMENT_OTHER)
Admission: EM | Admit: 2021-10-02 | Discharge: 2021-10-03 | Disposition: A | Discharge status: Home / Self Care | Payer: Medicaid Other | Attending: Emergency Medicine | Admitting: Emergency Medicine

## 2021-10-02 ENCOUNTER — Other Ambulatory Visit: Payer: Self-pay

## 2021-10-02 ENCOUNTER — Encounter (HOSPITAL_BASED_OUTPATIENT_CLINIC_OR_DEPARTMENT_OTHER): Payer: Self-pay | Admitting: *Deleted

## 2021-10-02 DIAGNOSIS — R1032 Left lower quadrant pain: Secondary | ICD-10-CM | POA: Insufficient documentation

## 2021-10-02 DIAGNOSIS — R030 Elevated blood-pressure reading, without diagnosis of hypertension: Secondary | ICD-10-CM | POA: Diagnosis not present

## 2021-10-02 NOTE — ED Triage Notes (Signed)
Pt states that she has had LLQ abdominal pain for the past 3 days.  This has been associated with nausea, no fever, chills or urinary issues.

## 2021-10-03 ENCOUNTER — Emergency Department (HOSPITAL_BASED_OUTPATIENT_CLINIC_OR_DEPARTMENT_OTHER): Payer: Medicaid Other

## 2021-10-03 LAB — URINALYSIS, ROUTINE W REFLEX MICROSCOPIC
Bilirubin Urine: NEGATIVE
Glucose, UA: NEGATIVE mg/dL
Hgb urine dipstick: NEGATIVE
Ketones, ur: NEGATIVE mg/dL
Nitrite: NEGATIVE
Protein, ur: NEGATIVE mg/dL
Specific Gravity, Urine: 1.027 (ref 1.005–1.030)
pH: 6 (ref 5.0–8.0)

## 2021-10-03 LAB — COMPREHENSIVE METABOLIC PANEL
ALT: 10 U/L (ref 0–44)
AST: 16 U/L (ref 15–41)
Albumin: 4.2 g/dL (ref 3.5–5.0)
Alkaline Phosphatase: 39 U/L (ref 38–126)
Anion gap: 6 (ref 5–15)
BUN: 16 mg/dL (ref 6–20)
CO2: 28 mmol/L (ref 22–32)
Calcium: 9.3 mg/dL (ref 8.9–10.3)
Chloride: 105 mmol/L (ref 98–111)
Creatinine, Ser: 0.69 mg/dL (ref 0.44–1.00)
GFR, Estimated: >60 mL/min (ref >60)
Glucose, Bld: 104 mg/dL — ABNORMAL HIGH (ref 70–99)
Potassium: 3.8 mmol/L (ref 3.5–5.1)
Sodium: 139 mmol/L (ref 135–145)
Total Bilirubin: 0.2 mg/dL — ABNORMAL LOW (ref 0.3–1.2)
Total Protein: 7.4 g/dL (ref 6.5–8.1)

## 2021-10-03 LAB — CBC
HCT: 37.7 % (ref 36.0–46.0)
Hemoglobin: 12.3 g/dL (ref 12.0–15.0)
MCH: 28.7 pg (ref 26.0–34.0)
MCHC: 32.6 g/dL (ref 30.0–36.0)
MCV: 87.9 fL (ref 80.0–100.0)
Platelets: 318 10*3/uL (ref 150–400)
RBC: 4.29 MIL/uL (ref 3.87–5.11)
RDW: 13.6 % (ref 11.5–15.5)
WBC: 9.5 10*3/uL (ref 4.0–10.5)
nRBC: 0 % (ref 0.0–0.2)

## 2021-10-03 LAB — LIPASE, BLOOD: Lipase: 23 U/L (ref 11–51)

## 2021-10-03 LAB — PREGNANCY, URINE: Preg Test, Ur: NEGATIVE

## 2021-10-03 MED ORDER — IOHEXOL 300 MG/ML  SOLN
100.0000 mL | Freq: Once | INTRAMUSCULAR | Status: AC | PRN
  Administered 2021-10-03: 100 mL via INTRAVENOUS

## 2021-10-03 NOTE — ED Notes (Signed)
Patient transported to CT 

## 2021-10-03 NOTE — Discharge Instructions (Addendum)
Please return in the morning for pelvic ultrasound to make sure that you do not have an ovarian cyst causing your pain.  Continue to take ibuprofen or naproxen as needed for pain.  If you need additional pain relief, you may add acetaminophen.  Please be aware that when you combine acetaminophen with either ibuprofen or naproxen, you get better pain relief than you get from taking either medication by itself.  Return to the emergency department if your pain is getting worse.

## 2021-10-03 NOTE — ED Provider Notes (Signed)
[facility] EMERGENCY DEPT Provider Note   CSN: [id number] Arrival date & time: 10/02/21  2325     History  Chief Complaint  Patient presents with   Abdominal Pain    Denise Pitts is a 41 y.o. female.  The history is provided by the patient.  Abdominal Pain She has no significant past history, and comes in with 3-day history of intermittent left lower quadrant pain.  There is some radiation of pain to the back.  She also relates that when she wipes her rectum, she feels a tingle that seems to go through to the left lower quadrant.  She denies nausea or vomiting.  She denies fever, chills, sweats.  She denies urinary urgency, frequency, tenesmus, dysuria.  She denies any vaginal discharge.  Last menses was 09/22/2021, and was normal.  She has been taking ibuprofen, which does give her temporary relief of pain.   Home Medications Prior to Admission medications   Medication Sig Start Date End Date Taking? Authorizing Provider  metroNIDAZOLE (METROGEL) 0.75 % vaginal gel Place 1 Applicatorful vaginally at bedtime. Apply one applicatorful to vagina at bedtime for 10 days, then twice a week for 6 months. 09/22/21   Anyanwu, Jethro Bastos, MD      Allergies    Patient has no known allergies.    Review of Systems   Review of Systems  Gastrointestinal:  Positive for abdominal pain.  All other systems reviewed and are negative.  Physical Exam Updated Vital Signs BP (!) 157/86 (BP Location: Right Arm)   Pulse 82   Temp 98.2 F (36.8 C) (Oral)   Resp 16   Wt 82.6 kg   LMP 09/22/2021 (Exact Date)   SpO2 98%   BMI 33.29 kg/m  Physical Exam Vitals and nursing note reviewed.  41 year old female, resting comfortably and in no acute distress. Vital signs are significant for elevated blood pressure. Oxygen saturation is 98%, which is normal. Head is normocephalic and atraumatic. PERRLA, EOMI. Oropharynx is clear. Neck is nontender and supple without adenopathy or JVD. Back  is nontender and there is no CVA tenderness. Lungs are clear without rales, wheezes, or rhonchi. Chest is nontender. Heart has regular rate and rhythm without murmur. Abdomen is soft, flat, with moderate left lower quadrant tenderness.  Tenderness is clearly outside of the pelvis.  There is no rebound or guarding. Extremities have no cyanosis or edema, full range of motion is present. Skin is warm and dry without rash. Neurologic: Mental status is normal, cranial nerves are intact, moves all extremities equally.  ED Results / Procedures / Treatments   Labs (all labs ordered are listed, but only abnormal results are displayed) Labs Reviewed  COMPREHENSIVE METABOLIC PANEL - Abnormal; Notable for the following components:      Result Value   Glucose, Bld 104 (*)    Total Bilirubin 0.2 (*)    All other components within normal limits  URINALYSIS, ROUTINE W REFLEX MICROSCOPIC - Abnormal; Notable for the following components:   Leukocytes,Ua SMALL (*)    All other components within normal limits  LIPASE, BLOOD  CBC  PREGNANCY, URINE   Radiology CT ABDOMEN PELVIS W CONTRAST  Result Date: 10/03/2021 CLINICAL DATA:  Left lower quadrant pain EXAM: CT ABDOMEN AND PELVIS WITH CONTRAST TECHNIQUE: Multidetector CT imaging of the abdomen and pelvis was performed using the standard protocol following bolus administration of intravenous contrast. RADIATION DOSE REDUCTION: This exam was performed according to the departmental dose-optimization program which includes  automated exposure control, adjustment of the mA and/or kV according to patient size and/or use of iterative reconstruction technique. CONTRAST:  OMNIPAQUE IOHEXOL 300 MG/ML  SOLN COMPARISON:  None Available. FINDINGS: Lower chest: No acute abnormality. Hepatobiliary: No focal liver abnormality is seen. No gallstones, gallbladder wall thickening, or biliary dilatation. Pancreas: Unremarkable. No pancreatic ductal dilatation or surrounding  inflammatory changes. Spleen: Normal in size without focal abnormality. Adrenals/Urinary Tract: Adrenal glands are unremarkable. Kidneys are normal, without renal calculi, focal lesion, or hydronephrosis. Bladder is unremarkable. Stomach/Bowel: Stomach is within normal limits. Appendix appears normal. No evidence of bowel wall thickening, distention, or inflammatory changes. Vascular/Lymphatic: No significant vascular findings are present. No enlarged abdominal or pelvic lymph nodes. Reproductive: No adnexal mass. Uterus unremarkable. Small amount of fluid and air within the vagina. Other: Negative for pelvic effusion or free air. Small fat containing umbilical hernia Musculoskeletal: No acute or significant osseous findings. IMPRESSION: 1. No CT evidence for acute intra-abdominal or pelvic abnormality. 2. Small amount of fluid and air in the vagina, correlate for any history of vaginal discharge. Electronically Signed   By: Jasmine Pang M.D.   On: 10/03/2021 03:07    Procedures Procedures    Medications Ordered in ED Medications - No data to display  ED Course/ Medical Decision Making/ A&P                           Medical Decision Making Amount and/or Complexity of Data Reviewed Labs: ordered. Radiology: ordered.  Risk Prescription drug management.   Left lower quadrant pain of uncertain cause.  Differential is broad and includes, but is not limited to, diverticulitis, urinary tract infection, pyelonephritis, urolithiasis, ovarian cyst.  Labs were ordered including CBC, comprehensive metabolic panel, lipase, urinalysis, pregnancy test.  I reviewed and interpreted all the laboratory tests.  She is not pregnant, CBC is normal, lipase is normal, metabolic panel is normal except for minimally elevated random glucose.  I have ordered CT of abdomen and pelvis with contrast to evaluate for possible diverticulitis, ovarian cyst, urolithiasis.  Old records are reviewed, and she has no relevant past  visits.  Last pelvic ultrasound on record was 05/19/2005, and was normal.  CT scan shows no acute process.  I have independently viewed the images, and agree with radiologist interpretation.  Radiologist does note some fluid in the vagina, patient is currently on MetroGel for bacterial vaginosis and this is likely what they are seeing.  She will be brought back in the morning for pelvic ultrasound.  In the meantime, she is to continue using over-the-counter NSAIDs and acetaminophen for pain.  Of note, patient was seen in her gynecologist office on 09/21/2021 at which time blood pressure was normal.  Final Clinical Impression(s) / ED Diagnoses Final diagnoses:  LLQ abdominal pain  Elevated blood pressure reading without diagnosis of hypertension    Rx / DC Orders ED Discharge Orders          Ordered    US PELVIC COMPLETE WITH TRANSVAGINAL        10/03/21 0357              Dione Booze, MD 10/03/21 0400

## 2021-10-05 ENCOUNTER — Ambulatory Visit (HOSPITAL_BASED_OUTPATIENT_CLINIC_OR_DEPARTMENT_OTHER)
Admission: RE | Admit: 2021-10-05 | Discharge: 2021-10-05 | Disposition: A | Discharge status: Home / Self Care | Payer: Medicaid Other | Source: Ambulatory Visit | Attending: Emergency Medicine | Admitting: Emergency Medicine

## 2021-10-05 DIAGNOSIS — R102 Pelvic and perineal pain: Secondary | ICD-10-CM | POA: Diagnosis present

## 2021-11-18 ENCOUNTER — Ambulatory Visit (INDEPENDENT_AMBULATORY_CARE_PROVIDER_SITE_OTHER): Payer: Medicaid Other | Admitting: Obstetrics & Gynecology

## 2021-11-18 ENCOUNTER — Encounter: Payer: Self-pay | Admitting: Obstetrics & Gynecology

## 2021-11-18 VITALS — BP 130/79 | HR 91 | Ht 62.0 in | Wt 189.0 lb

## 2021-11-18 DIAGNOSIS — N6459 Other signs and symptoms in breast: Secondary | ICD-10-CM | POA: Diagnosis not present

## 2021-11-18 DIAGNOSIS — Z01419 Encounter for gynecological examination (general) (routine) without abnormal findings: Secondary | ICD-10-CM

## 2021-11-18 NOTE — Progress Notes (Signed)
Last pap 03/2020. Patient thinks she maybe allergic to metrogel.Armandina Stammer RN

## 2021-11-18 NOTE — Addendum Note (Signed)
Addended by: Anell Barr on: 11/18/2021 04:45 PM   Modules accepted: Orders

## 2021-11-18 NOTE — Progress Notes (Signed)
Subjective:     Denise Pitts is a 41 y.o. female here for a routine exam.  Current complaints: pt reports freq BV in the summer. No sx at present. She used Metrogel and soon after developed severe abd pain for 5 days. Was seen in the ED. Wonders if this was an allergic rxn. Never had similar sx with Flagyl orally.   Pt sweats a lot and notices sweating of the upper thighs etc.      She is due to come on her cycles and feels some changes in her left breast. She is not sure if this is new. No tenderness at present but, does have tenderness with cycles.   Gynecologic History Patient's last menstrual period was 10/22/2021 (exact date). Contraception: tubal ligation Last Pap: 03/12/2020. Results were: normal Last mammogram: n/a.   Obstetric History OB History  Gravida Para Term Preterm AB Living  4 3 0 3 1 3   SAB IAB Ectopic Multiple Live Births    1     3    # Outcome Date GA Lbr Len/2nd Weight Sex Delivery Anes PTL Lv  4 Preterm 2010 [redacted]w[redacted]d    Vag-Spont   LIV  3 Preterm 2004 [redacted]w[redacted]d    Vag-Spont   LIV  2 Preterm 2003 [redacted]w[redacted]d    Vag-Spont   LIV  1 IAB            The following portions of the patient's history were reviewed and updated as appropriate: allergies, current medications, past family history, past medical history, past social history, past surgical history, and problem list.  Review of Systems Pertinent items are noted in HPI.    Objective:  BP 130/79   Pulse 91   Ht 5\' 2"  (1.575 m)   Wt 189 lb (85.7 kg)   LMP 10/22/2021 (Exact Date)   BMI 34.57 kg/m  General Appearance:    Alert, cooperative, no distress, appears stated age  Head:    Normocephalic, without obvious abnormality, atraumatic  Eyes:    conjunctiva/corneas clear, EOM's intact, both eyes  Ears:    Normal external ear canals, both ears  Nose:   Nares normal, septum midline, mucosa normal, no drainage    or sinus tenderness  Throat:   Lips, mucosa, and tongue normal; teeth and gums normal  Neck:   Supple,  symmetrical, trachea midline, no adenopathy;    thyroid:  no enlargement/tenderness/nodules  Back:     Symmetric, no curvature, ROM normal, no CVA tenderness  Lungs:     respirations unlabored  Chest Wall:    No tenderness or deformity   Heart:    Regular rate and rhythm  Breast Exam:    No tenderness, masses, or nipple abnormality. No nipple discharge. The area of the left breast does not have a palpable lesion.   Abdomen:     Soft, non-tender, bowel sounds active all four quadrants,    no masses, no organomegaly  Genitalia:    Normal female without lesion, discharge or tenderness     Extremities:   Extremities normal, atraumatic, no cyanosis or edema  Pulses:   2+ and symmetric all extremities  Skin:   Skin color, texture, turgor normal, no rashes or lesions     Assessment:    Healthy female exam.  Sweating of upper thighs- may use Nume. Unscented.  Breast complaint- no distinct mass noted. Rec reexamine following cycle. If she still notices something, I have asked her to f/u for a repeat exam and we  can send her for imaging.      Plan:   [facility] was seen today for gynecologic exam.  Diagnoses and all orders for this visit:  Well female exam with routine gynecological exam -     Cytology - PAP( [facility])  Breast complaint   Repeat exam after cycle. If still feels something, f/u for exam and imaging.  F/u for 1 year for annual.   Castin Donaghue L. Harraway-Smith, M.D., [place]

## 2021-12-08 ENCOUNTER — Encounter: Payer: Self-pay | Admitting: Obstetrics & Gynecology

## 2021-12-08 DIAGNOSIS — N760 Acute vaginitis: Secondary | ICD-10-CM

## 2021-12-08 MED ORDER — BORIC ACID CRYS: 600.0000 mg | CRYSTALS | Freq: Every day | 5 refills | Status: DC

## 2021-12-09 ENCOUNTER — Encounter: Payer: Self-pay | Admitting: Obstetrics & Gynecology

## 2021-12-16 ENCOUNTER — Ambulatory Visit: Payer: Medicaid Other | Admitting: Obstetrics & Gynecology

## 2021-12-16 ENCOUNTER — Encounter: Payer: Self-pay | Admitting: General Practice

## 2021-12-16 ENCOUNTER — Other Ambulatory Visit: Payer: Self-pay | Admitting: Obstetrics & Gynecology

## 2021-12-16 ENCOUNTER — Encounter: Payer: Self-pay | Admitting: Obstetrics & Gynecology

## 2021-12-16 VITALS — BP 139/88 | HR 55 | Temp 98.4°F | Wt 185.0 lb

## 2021-12-16 DIAGNOSIS — N649 Disorder of breast, unspecified: Secondary | ICD-10-CM

## 2021-12-16 DIAGNOSIS — R030 Elevated blood-pressure reading, without diagnosis of hypertension: Secondary | ICD-10-CM

## 2021-12-16 DIAGNOSIS — N760 Acute vaginitis: Secondary | ICD-10-CM

## 2021-12-16 DIAGNOSIS — B9689 Other specified bacterial agents as the cause of diseases classified elsewhere: Secondary | ICD-10-CM | POA: Diagnosis not present

## 2021-12-16 MED ORDER — METRONIDAZOLE 500 MG PO TABS: 500.0000 mg | ORAL_TABLET | Freq: Two times a day (BID) | ORAL | 0 refills | Status: DC

## 2021-12-16 NOTE — Progress Notes (Signed)
History:  41 y.o. [id number] here today for eval of lesion on left breast. She reports that following her menses the lesion got smaller but, then returned soon after. There is no tenderness or nipple discharge. She has a remote FH of breast cancer in her great grandmother.      The following portions of the patient's history were reviewed and updated as appropriate: allergies, current medications, past family history, past medical history, past social history, past surgical history and problem list.  Review of Systems:  Pertinent items are noted in HPI.    Objective:  Physical Exam Blood pressure (!) 149/101, pulse (!) 55, temperature 98.4 F (36.9 C), weight 185 lb (83.9 kg), last menstrual period 11/22/2021.  CONSTITUTIONAL: Well-developed, well-nourished female in no acute distress.  HENT:  Normocephalic, atraumatic EYES: Conjunctivae and EOM are normal. No scleral icterus.  NECK: Normal range of motion SKIN: Skin is warm and dry. No rash noted. Not diaphoretic.No pallor. NEUROLGIC: Alert and oriented to person, place, and time. Normal coordination.  Breasts: right breast normal without mass, skin or nipple changes or axillary nodes, abnormal mass palpable lesion on left breast at 5 o'clock. This is mobile about 2 cm. Nontender.    Assessment & Plan:  Left breast lesion   Breast US with aspiration if indicated  BV-  Was on GEL which is not working. Requests tabs  Flagyl 500mg  bid x 7 days  Elevated BP. Dx'd with HTN. Did not start med. Was rx'd HCTZ D/w pt risks of untreated BP. Reviewed her home BPs.  Pt will start meds.  HCTZ 25 mg po q day   F/u in 6 weeks or sooner prn     Sentoria Brent L. Harraway-Smith, M.D., 

## 2021-12-28 ENCOUNTER — Ambulatory Visit
Admission: RE | Admit: 2021-12-28 | Discharge: 2021-12-28 | Disposition: A | Discharge status: Home / Self Care | Payer: Medicaid Other | Source: Ambulatory Visit | Attending: Obstetrics & Gynecology | Admitting: Obstetrics & Gynecology

## 2021-12-28 DIAGNOSIS — N649 Disorder of breast, unspecified: Secondary | ICD-10-CM

## 2022-01-25 ENCOUNTER — Encounter: Payer: Self-pay | Admitting: Obstetrics & Gynecology

## 2022-01-27 ENCOUNTER — Ambulatory Visit: Payer: Medicaid Other | Admitting: Obstetrics & Gynecology

## 2022-02-21 ENCOUNTER — Encounter: Payer: Self-pay | Admitting: Obstetrics & Gynecology

## 2022-02-22 ENCOUNTER — Other Ambulatory Visit (HOSPITAL_COMMUNITY)
Admission: RE | Admit: 2022-02-22 | Discharge: 2022-02-22 | Disposition: A | Discharge status: Home / Self Care | Payer: Medicaid Other | Source: Ambulatory Visit | Attending: Obstetrics and Gynecology | Admitting: Obstetrics and Gynecology

## 2022-02-22 ENCOUNTER — Ambulatory Visit (INDEPENDENT_AMBULATORY_CARE_PROVIDER_SITE_OTHER): Payer: Medicaid Other

## 2022-02-22 VITALS — BP 148/94 | HR 70 | Wt 179.0 lb

## 2022-02-22 DIAGNOSIS — Z113 Encounter for screening for infections with a predominantly sexual mode of transmission: Secondary | ICD-10-CM | POA: Insufficient documentation

## 2022-02-22 NOTE — Progress Notes (Signed)
Patient presents for routine STI screening. Patient denies any known exposure. Self swab was sent to the lab. Patient was sent to the to have labs drawn. Anju Sereno l Dan Scearce, CMA

## 2022-02-23 LAB — HIV ANTIBODY (ROUTINE TESTING W REFLEX): HIV Screen 4th Generation wRfx: NONREACTIVE

## 2022-02-23 LAB — HEPATITIS B SURFACE ANTIGEN: Hepatitis B Surface Ag: NEGATIVE

## 2022-02-23 LAB — RPR: RPR Ser Ql: NONREACTIVE

## 2022-02-23 LAB — HEPATITIS C ANTIBODY: Hep C Virus Ab: NONREACTIVE

## 2022-02-24 LAB — CERVICOVAGINAL ANCILLARY ONLY
Chlamydia: NEGATIVE
Comment: NEGATIVE
Comment: NEGATIVE
Comment: NORMAL
Neisseria Gonorrhea: NEGATIVE
Trichomonas: NEGATIVE

## 2022-05-13 ENCOUNTER — Other Ambulatory Visit: Payer: Self-pay

## 2022-05-13 ENCOUNTER — Emergency Department (HOSPITAL_BASED_OUTPATIENT_CLINIC_OR_DEPARTMENT_OTHER)
Admission: EM | Admit: 2022-05-13 | Discharge: 2022-05-13 | Disposition: A | Discharge status: Home / Self Care | Payer: Medicaid Other | Attending: Emergency Medicine | Admitting: Emergency Medicine

## 2022-05-13 ENCOUNTER — Emergency Department (HOSPITAL_BASED_OUTPATIENT_CLINIC_OR_DEPARTMENT_OTHER): Payer: Medicaid Other

## 2022-05-13 ENCOUNTER — Other Ambulatory Visit (HOSPITAL_BASED_OUTPATIENT_CLINIC_OR_DEPARTMENT_OTHER): Payer: Self-pay

## 2022-05-13 ENCOUNTER — Encounter (HOSPITAL_BASED_OUTPATIENT_CLINIC_OR_DEPARTMENT_OTHER): Payer: Self-pay

## 2022-05-13 DIAGNOSIS — I10 Essential (primary) hypertension: Secondary | ICD-10-CM | POA: Diagnosis not present

## 2022-05-13 DIAGNOSIS — R55 Syncope and collapse: Secondary | ICD-10-CM

## 2022-05-13 DIAGNOSIS — U071 COVID-19: Secondary | ICD-10-CM | POA: Insufficient documentation

## 2022-05-13 DIAGNOSIS — E860 Dehydration: Secondary | ICD-10-CM

## 2022-05-13 DIAGNOSIS — E861 Hypovolemia: Secondary | ICD-10-CM | POA: Insufficient documentation

## 2022-05-13 DIAGNOSIS — I9589 Other hypotension: Secondary | ICD-10-CM | POA: Insufficient documentation

## 2022-05-13 DIAGNOSIS — R519 Headache, unspecified: Secondary | ICD-10-CM | POA: Diagnosis present

## 2022-05-13 LAB — RESP PANEL BY RT-PCR (RSV, FLU A&B, COVID)  RVPGX2
Influenza A by PCR: NEGATIVE
Influenza B by PCR: NEGATIVE
Resp Syncytial Virus by PCR: NEGATIVE
SARS Coronavirus 2 by RT PCR: POSITIVE — AB

## 2022-05-13 LAB — CBC WITH DIFFERENTIAL/PLATELET
Abs Immature Granulocytes: 0.01 10*3/uL (ref 0.00–0.07)
Basophils Absolute: 0 10*3/uL (ref 0.0–0.1)
Basophils Relative: 0 %
Eosinophils Absolute: 0 10*3/uL (ref 0.0–0.5)
Eosinophils Relative: 0 %
HCT: 38.9 % (ref 36.0–46.0)
Hemoglobin: 12.6 g/dL (ref 12.0–15.0)
Immature Granulocytes: 0 %
Lymphocytes Relative: 15 %
Lymphs Abs: 1.1 10*3/uL (ref 0.7–4.0)
MCH: 28.1 pg (ref 26.0–34.0)
MCHC: 32.4 g/dL (ref 30.0–36.0)
MCV: 86.8 fL (ref 80.0–100.0)
Monocytes Absolute: 0.4 10*3/uL (ref 0.1–1.0)
Monocytes Relative: 5 %
Neutro Abs: 6.1 10*3/uL (ref 1.7–7.7)
Neutrophils Relative %: 80 %
Platelets: 274 10*3/uL (ref 150–400)
RBC: 4.48 MIL/uL (ref 3.87–5.11)
RDW: 13.3 % (ref 11.5–15.5)
WBC: 7.7 10*3/uL (ref 4.0–10.5)
nRBC: 0 % (ref 0.0–0.2)

## 2022-05-13 LAB — BASIC METABOLIC PANEL
Anion gap: 7 (ref 5–15)
BUN: 9 mg/dL (ref 6–20)
CO2: 25 mmol/L (ref 22–32)
Calcium: 8.3 mg/dL — ABNORMAL LOW (ref 8.9–10.3)
Chloride: 103 mmol/L (ref 98–111)
Creatinine, Ser: 0.75 mg/dL (ref 0.44–1.00)
GFR, Estimated: >60 mL/min (ref >60)
Glucose, Bld: 114 mg/dL — ABNORMAL HIGH (ref 70–99)
Potassium: 3.5 mmol/L (ref 3.5–5.1)
Sodium: 135 mmol/L (ref 135–145)

## 2022-05-13 LAB — URINALYSIS, ROUTINE W REFLEX MICROSCOPIC

## 2022-05-13 LAB — PREGNANCY, URINE: Preg Test, Ur: NEGATIVE

## 2022-05-13 LAB — URINALYSIS, MICROSCOPIC (REFLEX): RBC / HPF: >50 RBC/hpf (ref 0–5)

## 2022-05-13 LAB — CBG MONITORING, ED: Glucose-Capillary: 94 mg/dL (ref 70–99)

## 2022-05-13 MED ORDER — ONDANSETRON HCL 4 MG PO TABS
4.0000 mg | ORAL_TABLET | Freq: Three times a day (TID) | ORAL | 0 refills | Status: AC | PRN
  Filled 2022-05-13: qty 15, 5d supply, fill #0

## 2022-05-13 MED ORDER — SODIUM CHLORIDE 0.9 % IV BOLUS
1000.0000 mL | Freq: Once | INTRAVENOUS | Status: AC
  Administered 2022-05-13: 1000 mL via INTRAVENOUS

## 2022-05-13 NOTE — ED Notes (Signed)
Reviewed discharge instructions follow up and medication with pt. Work note provided. Pt ambulatory for dischatge

## 2022-05-13 NOTE — Discharge Instructions (Signed)
Thank you for letting us take care of you today.  As discussed, you tested positive for COVID-19. Your other blood work, EKG, and chest x-ray were normal. Please treat your symptoms with over the counter medications including Tylenol and Motrin for pain or fever, Robitussin or Mucinex for cough and congestion, and use of a humidifier at home. Be sure if you use over the counter cold medications they do not also contain Tylenol (acetaminophen) or Motrin (ibuprofen) and if so you are not taking these separately at the same time.  It is very important to stay well hydrated. I recommend water and electrolyte replacement solutions such as Gatorade, Powerade, or Pedialyte.   Do not return to work if you develop a fever for at least 24 hours. If you need to go in public, wear a mask until 10 days from when your symptoms began.  If you develop chest pain, shortness of breath, fever greater than 103 F, inability to eat or drink, uncontrollable vomiting, concern for dehydration, or other concerns, please be re-evaluated at nearest emergency department.

## 2022-05-13 NOTE — ED Provider Notes (Signed)
[facility] Provider Note   CSN: [id number] Arrival date & time: 05/13/22  1033     History  Chief Complaint  Patient presents with   Weakness    Denise Pitts is a 42 y.o. female with past medical history hypertension and currently not on any antihypertensives who presents to the ED complaining of near syncopal episode just prior to arrival while at work.  Patient reports that she began to feel lightheaded and dizzy as well as nauseated with a generalized bilateral frontal headache and needed to sit down.  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  Patient had some water while sitting down and blood pressure was rechecked which improved to the 542H systolic.  Patient reports that she is feeling better now and her nausea comes and goes but that she generally does not feel well.  She has had cold-like symptoms for the past 4 days including a cough and congestion.  She denies chest pain or shortness of breath.  She denies fever or chills.  She reports that she took a short train ride to [place] last Saturday and that she easily develops motion sickness and had some nausea, vomiting, and diarrhea with this but otherwise has not had any more of the symptoms and has no abdominal pain.  She denies previous history of syncope she denies any collapse today and did not fall to the floor or hit her head.  No other recent head injuries.  She reports that she has been eating and drinking but does feel dehydrated due to being sick recently.  No known sick contacts.  She has a history of hypertension for which she was prescribed HCTZ but states that she had some weight loss and blood pressure has been well-controlled and she is not currently on any prescription antihypertensives. She denies recent surgery, recent long distance travel, hormone use, or history of DVT/PE. No leg pain or swelling.        Home Medications Prior to Admission medications   Medication Sig Start Date End Date Taking? Authorizing Provider  ondansetron (ZOFRAN) 4 MG tablet Take 1 tablet (4 mg total) by mouth every 8 (eight) hours as needed for up to 5 days for nausea or vomiting. 05/13/22 05/18/22 Yes Alaura Schippers L, PA-C  Boric Acid [facility] 600 mg vaginally at bedtime. Use vaginally every night for two weeks then twice a week for six months Patient not taking: Reported on 12/16/2021 12/08/21   Anyanwu, Sallyanne Havers, MD  metroNIDAZOLE (FLAGYL) 500 MG tablet Take 1 tablet (500 mg total) by mouth 2 (two) times daily. Patient not taking: Reported on 02/22/2022 12/16/21   Lavonia Drafts, MD  metroNIDAZOLE (METROGEL) 0.75 % vaginal gel Place 1 Applicatorful vaginally at bedtime. Apply one applicatorful to vagina at bedtime for 10 days, then twice a week for 6 months. Patient not taking: Reported on 02/22/2022 09/22/21   Osborne Oman, MD      Allergies    Patient has no known allergies.    Review of Systems   Review of Systems  Constitutional:  Positive for fatigue. Negative for activity change, appetite change, chills and fever.  HENT:  Positive for congestion and rhinorrhea. Negative for ear pain, sore throat, trouble swallowing and voice change.   Eyes:  Negative for photophobia, pain and visual disturbance.  Respiratory:  Positive for cough. Negative for apnea, chest tightness, shortness of breath  and wheezing.   Cardiovascular:  Negative for chest pain, palpitations and leg swelling.  Gastrointestinal:  Positive for diarrhea, nausea and vomiting. Negative for abdominal pain and blood in stool.  Genitourinary:  Negative for difficulty urinating, dysuria, flank pain and hematuria.  Musculoskeletal:  Negative for arthralgias, back pain, gait problem, neck pain and neck stiffness.  Skin:  Negative for color change and rash.  Neurological:  Positive for dizziness, weakness, light-headedness and headaches.  Negative for seizures, syncope, facial asymmetry, speech difficulty and numbness.  Psychiatric/Behavioral:  Negative for confusion.   All other systems reviewed and are negative.   Physical Exam Updated Vital Signs BP (!) 145/106   Pulse 67   Temp 97.8 F (36.6 C)   Resp 17   Ht 5\' 2"  (1.575 m)   Wt 82.1 kg   LMP 05/13/2022   SpO2 100%   BMI 33.11 kg/m  Physical Exam Vitals and nursing note reviewed.  Constitutional:      General: She is not in acute distress.    Appearance: Normal appearance. She is not ill-appearing or toxic-appearing.  HENT:     Head: Normocephalic and atraumatic.     Right Ear: Tympanic membrane, ear canal and external ear normal.     Left Ear: Tympanic membrane, ear canal and external ear normal.     Nose: Congestion present.     Mouth/Throat:     Mouth: Mucous membranes are dry.     Pharynx: Oropharynx is clear. No oropharyngeal exudate or posterior oropharyngeal erythema.  Eyes:     General: No scleral icterus.    Conjunctiva/sclera: Conjunctivae normal.  Cardiovascular:     Rate and Rhythm: Normal rate and regular rhythm.     Heart sounds: Normal heart sounds. No murmur heard. Pulmonary:     Effort: Pulmonary effort is normal.     Breath sounds: Normal breath sounds. No wheezing, rhonchi or rales.  Abdominal:     General: Abdomen is flat. There is no distension.     Palpations: Abdomen is soft.     Tenderness: There is no abdominal tenderness. There is no right CVA tenderness, left CVA tenderness, guarding or rebound.  Musculoskeletal:        General: No tenderness (to bilateral calves/lower extremities). Normal range of motion.     Cervical back: Normal range of motion and neck supple. No tenderness.     Right lower leg: No edema.     Left lower leg: No edema.  Skin:    General: Skin is warm and dry.     Capillary Refill: Capillary refill takes less than 2 seconds.  Neurological:     General: No focal deficit present.     Mental Status:  She is alert and oriented to person, place, and time.     Cranial Nerves: Cranial nerves 2-12 are intact. No cranial nerve deficit, dysarthria or facial asymmetry.     Sensory: No sensory deficit.     Motor: Motor function is intact. No weakness.  Psychiatric:        Mood and Affect: Mood normal.        Behavior: Behavior normal.     ED Results / Procedures / Treatments   Labs (all labs ordered are listed, but only abnormal results are displayed) Labs Reviewed  RESP PANEL BY RT-PCR (RSV, FLU A&B, COVID)  RVPGX2 - Abnormal; Notable for the following components:      Result Value   SARS Coronavirus 2 by RT PCR POSITIVE (*)  All other components within normal limits  BASIC METABOLIC PANEL - Abnormal; Notable for the following components:   Glucose, Bld 114 (*)    Calcium 8.3 (*)    All other components within normal limits  URINALYSIS, ROUTINE W REFLEX MICROSCOPIC - Abnormal; Notable for the following components:   Color, Urine RED (*)    APPearance CLOUDY (*)    Glucose, UA   (*)    Value: TEST NOT REPORTED DUE TO COLOR INTERFERENCE OF URINE PIGMENT   Hgb urine dipstick   (*)    Value: TEST NOT REPORTED DUE TO COLOR INTERFERENCE OF URINE PIGMENT   Bilirubin Urine   (*)    Value: TEST NOT REPORTED DUE TO COLOR INTERFERENCE OF URINE PIGMENT   Ketones, ur   (*)    Value: TEST NOT REPORTED DUE TO COLOR INTERFERENCE OF URINE PIGMENT   Protein, ur   (*)    Value: TEST NOT REPORTED DUE TO COLOR INTERFERENCE OF URINE PIGMENT   Nitrite   (*)    Value: TEST NOT REPORTED DUE TO COLOR INTERFERENCE OF URINE PIGMENT   Leukocytes,Ua   (*)    Value: TEST NOT REPORTED DUE TO COLOR INTERFERENCE OF URINE PIGMENT   All other components within normal limits  URINALYSIS, MICROSCOPIC (REFLEX) - Abnormal; Notable for the following components:   Bacteria, UA MANY (*)    All other components within normal limits  CBC WITH DIFFERENTIAL/PLATELET  PREGNANCY, URINE  CBG MONITORING, ED    EKG Sinus  rhythm at 66bmp, no ST-T changes, normal intervals, no STEMI  Radiology DG Chest 2 View  Result Date: 05/13/2022 CLINICAL DATA:  Cough and congestion.  Near syncope EXAM: CHEST - 2 VIEW COMPARISON:  03/16/2021 x-ray series and older FINDINGS: The heart size and mediastinal contours are within normal limits. Both lungs are clear. The visualized skeletal structures are unremarkable. IMPRESSION: No acute cardiopulmonary disease Electronically Signed   By: Karen Kays M.D.   On: 05/13/2022 11:30    Procedures  Orthostatic Lying BP- Lying: 141/91 Abnormal  Pulse- Lying: 83 (Patient stated that she feels dizzy) Orthostatic Sitting BP- Sitting: 147/101 Abnormal  Pulse- Sitting: 76 (Patient stated that not as dizzy when moved from flat to sitting up on side of bed) Orthostatic Standing at 0 minutes BP- Standing at 0 minutes: 154/97 Abnormal  Pulse- Standing at 0 minutes: 73 (Patient stated that not as dizzy as laying flat. Feels the same as just sitting up) Orthostatic Standing at 3 minutes BP- Standing at 3 minutes: 156/95 Abnormal  Pulse- Standing at 3 minutes: 112 (Patient stated that she still feels the same not dizzy)    Medications Ordered in ED Medications  sodium chloride 0.9 % bolus 1,000 mL (0 mLs Intravenous Stopped 05/13/22 1303)    ED Course/ Medical Decision Making/ A&P                           Medical Decision Making Amount and/or Complexity of Data Reviewed Labs: ordered. Decision-making details documented in ED Course. Radiology: ordered. Decision-making details documented in ED Course. ECG/medicine tests: ordered. Decision-making details documented in ED Course.  Risk Prescription drug management.   Wells' Criteria for Pulmonary Embolism from [email or web address]  on 05/13/2022 ** All calculations should be rechecked by clinician prior to use **  RESULT SUMMARY: 0.0 points Low risk group: 1.3% chance of PE in an ED population.   Another study assigned scores ? 4 as "PE  Unlikely" and had a 3% incidence of PE.   INPUTS: Clinical signs and symptoms of DVT --> 0 = No PE is #1 diagnosis OR equally likely --> 0 = No Heart rate > 100 --> 0 = No Immobilization at least 3 days OR surgery in the previous 4 weeks --> 0 = No Previous, objectively diagnosed PE or DVT --> 0 = No Hemoptysis --> 0 = No Malignancy w/ treatment within 6 months or palliative --> 0 = No   This is a 42 year old female presenting to the ED for near syncopal episode just prior to arrival.  Patient hypotensive at place of employment where she had this episod but has significant improvement with p.o. intake and is normotensive on arrival to the ED.  She has had recent upper respiratory symptoms including a cough and congestion, difficult to discern onset of the symptoms this patient for states that started 2 days ago but then stated they started approximately 4 days ago. No recent surgery, significant travel, hormone use, history of DVT/PE, leg pain or leg swelling, significant recent travel so do not suspect DVT/PE. Regardless, patient did test positive for COVID-19 today.  Her EKG was unremarkable with no ST-T changes, normal intervals, and normal sinus rhythm.  She is having no chest pain and no shortness of breath.  She is not orthostatic here today.  She has not been febrile and is satting at 100% on room air.  Labs essentially otherwise unremarkable with normal electrolytes, no leukocytosis, and negative chest x-ray.  Lungs clear to auscultation on exam so with these findings do not suspect pneumonia.  Neurologically intact. She has no history of underlying lung disease and other than a history of hypertension that is currently diet and weight controlled she has no history of other chronic medical problems thus she is low risk.  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  She is feeling much better on reexam, not currently nauseated, not currently lightheaded, and vital signs remain stable.  Patient comfortable and would like to be discharged home at this time.  Aware of need to isolate due to COVID-19 diagnosis as well as given strict return precautions for inability to tolerate p.o., fever greater than 103 Fahrenheit, uncontrollable vomiting, other concern for dehydration, chest pain, shortness of breath, or other concerns.  Patient expressed understanding of this plan and will treat symptoms at home with over-the-counter medications.  All questions answered and patient stable for discharge.     Final Clinical Impression(s) / ED Diagnoses Final diagnoses:  COVID-19  Dehydration  Near syncope  Hypotension due to hypovolemia    Rx / DC Orders ED Discharge Orders          Ordered    ondansetron (ZOFRAN) 4 MG tablet  Every 8 hours PRN        05/13/22 1405              Tonette Lederer, PA-C 05/13/22 1433    Blane Ohara, MD 05/13/22 [id number]

## 2022-05-13 NOTE — ED Triage Notes (Addendum)
Pt reports she has been coughing and congested 2 days. Reports taking mucinex. Felt dizzy and like she was going to pass out at work. BP checked 80/60 Reports HA , nauseated and dizzy

## 2022-05-13 NOTE — ED Notes (Signed)
Pt currently on menstrual cycle

## 2022-09-10 IMAGING — CT CT ABD-PELV W/ CM
2 of 4 series · 16 of 46 positions shown, 18 images · IV contrast (APPLIED)
Comparison: None Available.

CLINICAL DATA: Left lower quadrant pain

EXAM:
CT ABDOMEN AND PELVIS WITH CONTRAST
TECHNIQUE: Multidetector CT imaging of the abdomen and pelvis was performed
using the standard protocol following bolus administration of
intravenous contrast.

[Series 2: abd pel w · axial · 0.62mm/px · z∈[-554,-184]mm · 13 of 82 slices shown, 15 images]
[im 4/82  soft-tissue]
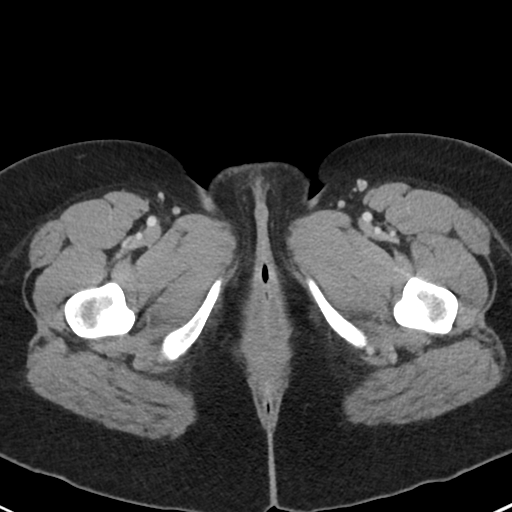
[im 4/82  bone]
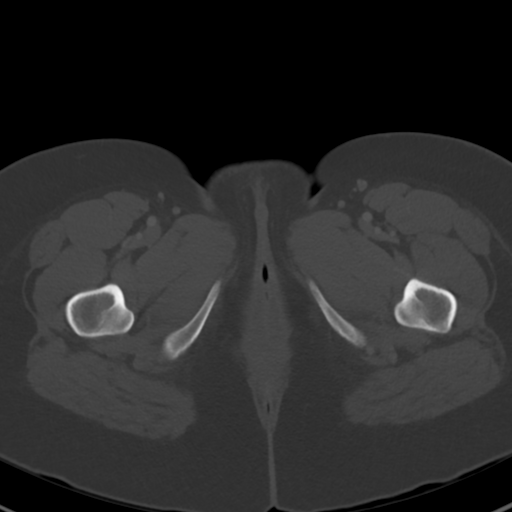
[im 11/82  soft-tissue]
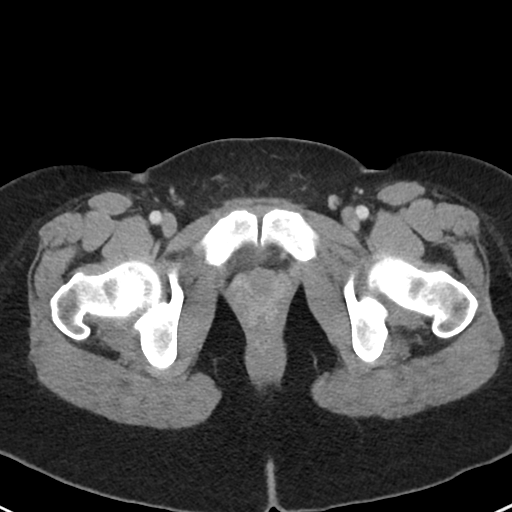
[im 17/82  soft-tissue]
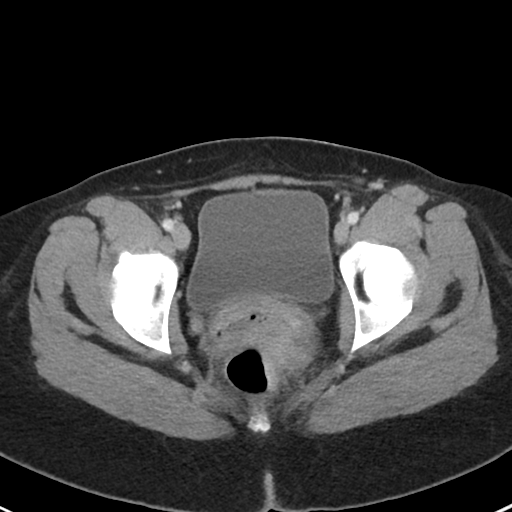
[im 24/82  soft-tissue]
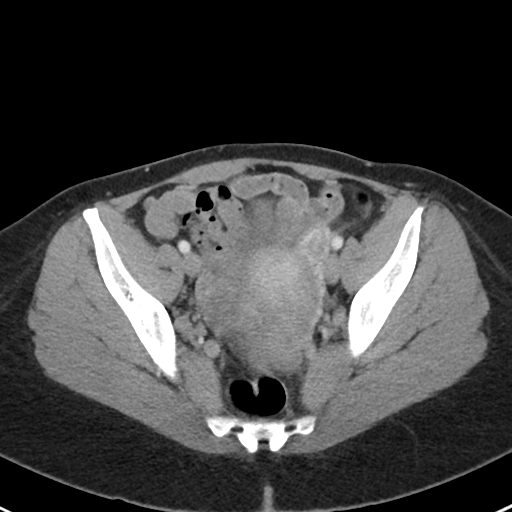
[im 28/82  soft-tissue]
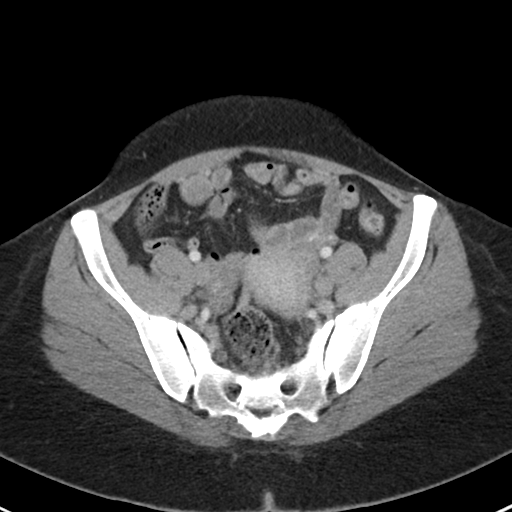
[im 34/82  soft-tissue]
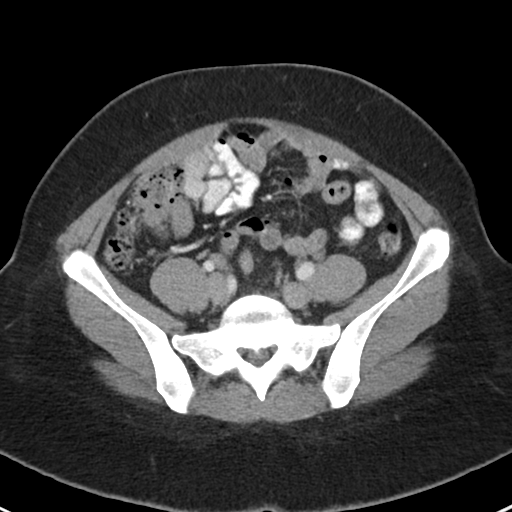
[im 41/82  soft-tissue]
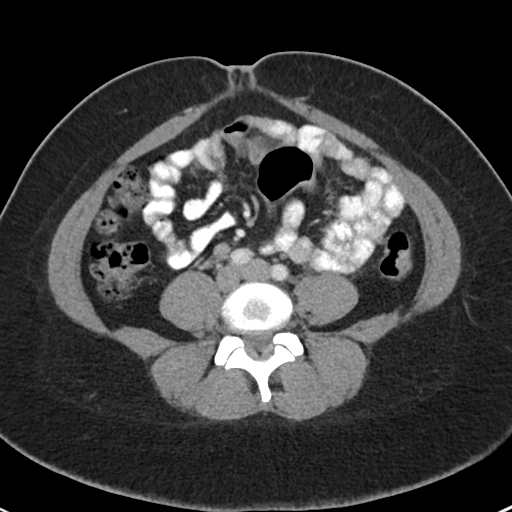
[im 48/82  soft-tissue]
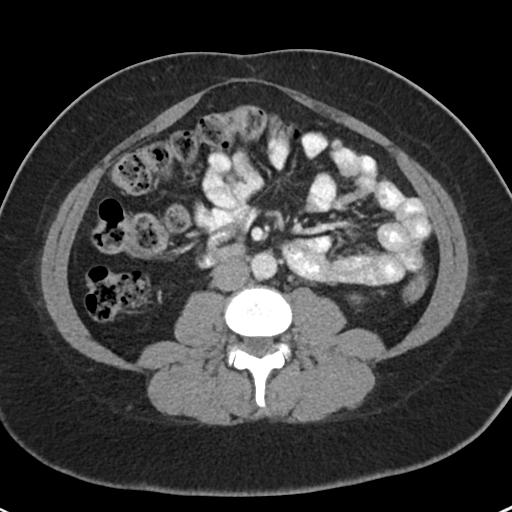
[im 55/82  soft-tissue]
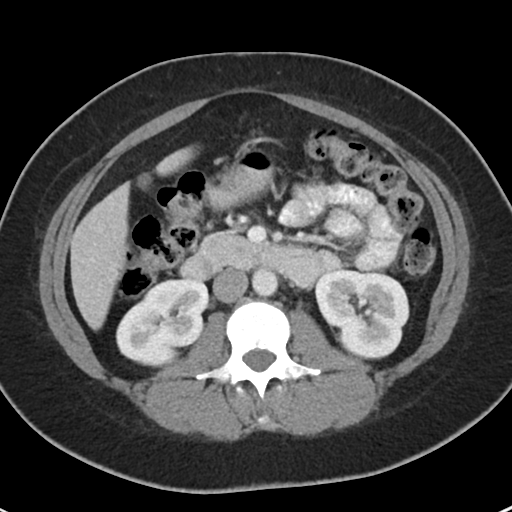
[im 55/82  bone]
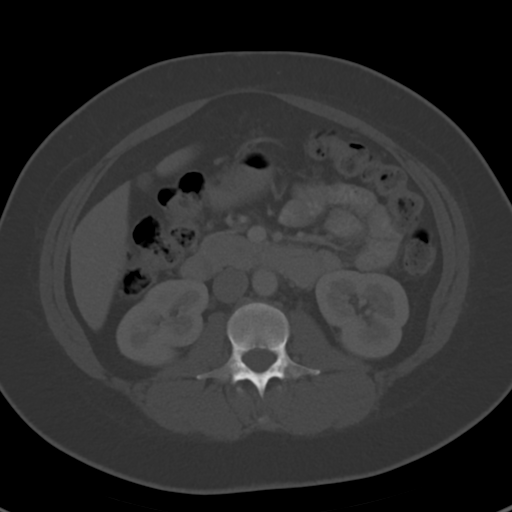
[im 58/82  soft-tissue]
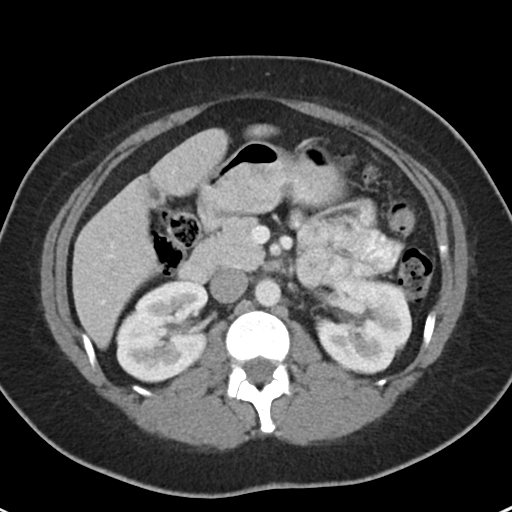
[im 65/82  soft-tissue]
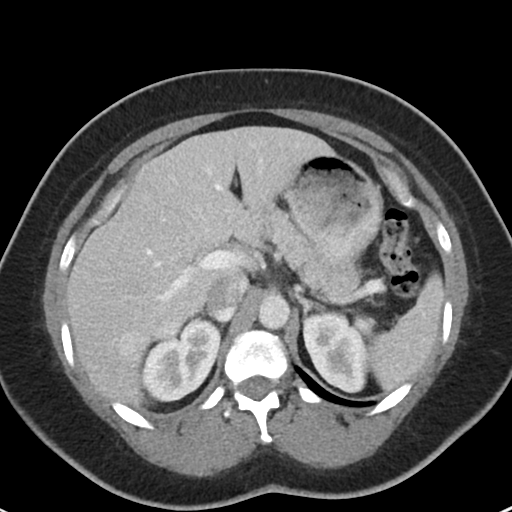
[im 71/82  soft-tissue]
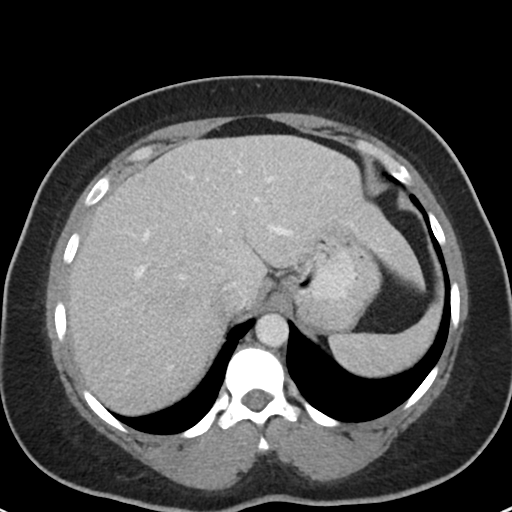
[im 78/82  soft-tissue]
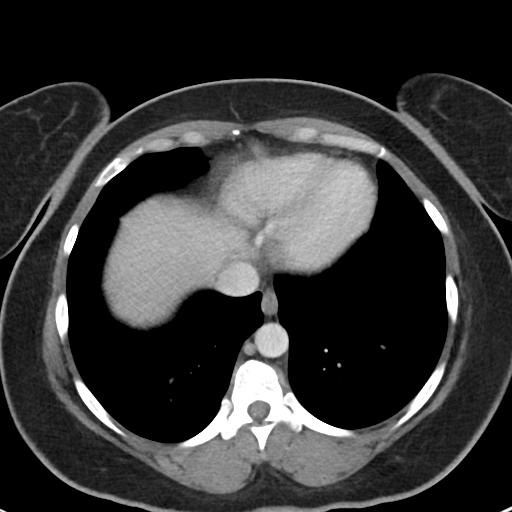

[Series 5: coronal · coronal · 0.61mm/px · 3 of 92 slices shown]
[im 31/92  soft-tissue]
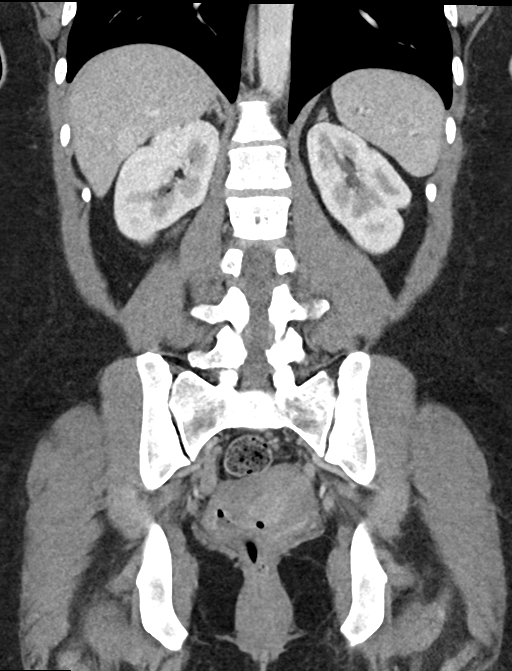
[im 41/92  soft-tissue]
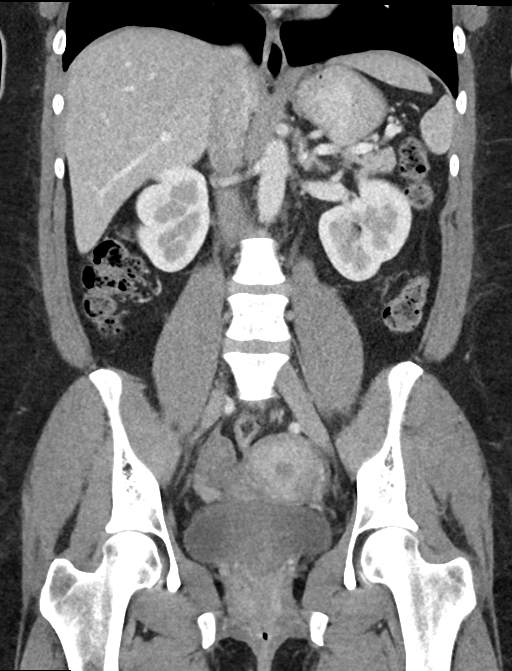
[im 51/92  soft-tissue]
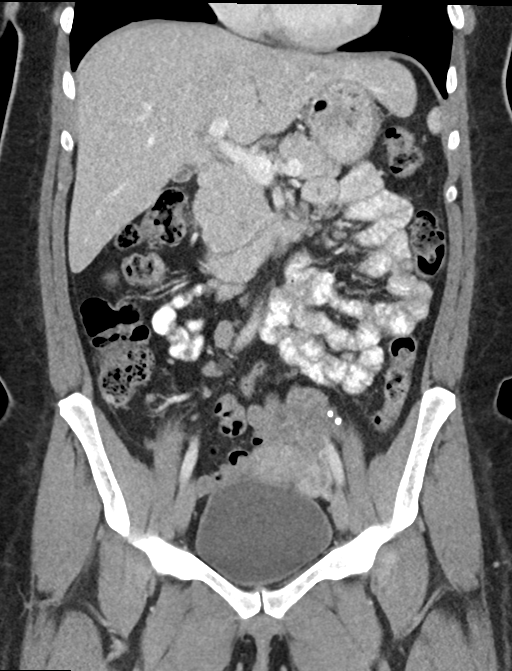

[16 of 46 positions shown; findings below may reference images not displayed]

RADIATION DOSE REDUCTION: This exam was performed according to the
departmental dose-optimization program which includes automated
exposure control, adjustment of the mA and/or kV according to
patient size and/or use of iterative reconstruction technique.

CONTRAST:  100mL OMNIPAQUE IOHEXOL 300 MG/ML  SOLN
FINDINGS: Lower chest: No acute abnormality.

Hepatobiliary: No focal liver abnormality is seen. No gallstones,
gallbladder wall thickening, or biliary dilatation.

Pancreas: Unremarkable. No pancreatic ductal dilatation or
surrounding inflammatory changes.

Spleen: Normal in size without focal abnormality.

Adrenals/Urinary Tract: Adrenal glands are unremarkable. Kidneys are
normal, without renal calculi, focal lesion, or hydronephrosis.
Bladder is unremarkable.

Stomach/Bowel: Stomach is within normal limits. Appendix appears
normal. No evidence of bowel wall thickening, distention, or
inflammatory changes.

Vascular/Lymphatic: No significant vascular findings are present. No
enlarged abdominal or pelvic lymph nodes.

Reproductive: No adnexal mass. Uterus unremarkable. Small amount of
fluid and air within the vagina.

Other: Negative for pelvic effusion or free air. Small fat
containing umbilical hernia

Musculoskeletal: No acute or significant osseous findings.
IMPRESSION: 1. No CT evidence for acute intra-abdominal or pelvic abnormality.
2. Small amount of fluid and air in the vagina, correlate for any
history of vaginal discharge.

## 2022-09-12 IMAGING — US US PELVIS COMPLETE WITH TRANSVAGINAL
1 series · 14 of 25 positions shown · non-contrast
Comparison: None

CLINICAL DATA: LEFT pelvic pain 6 days, LMP 09/22/2021



[Series 1: us pelvic complete with transvaginal · 14 of 56 slices shown]
[im 1/56]
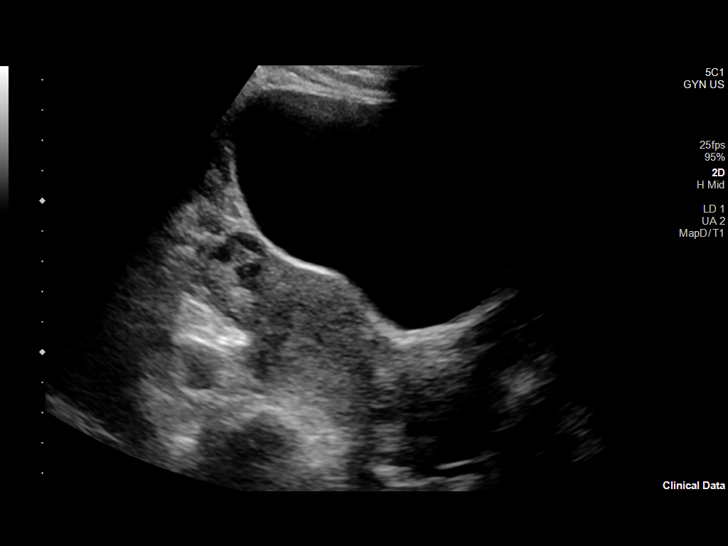
[im 5/56]
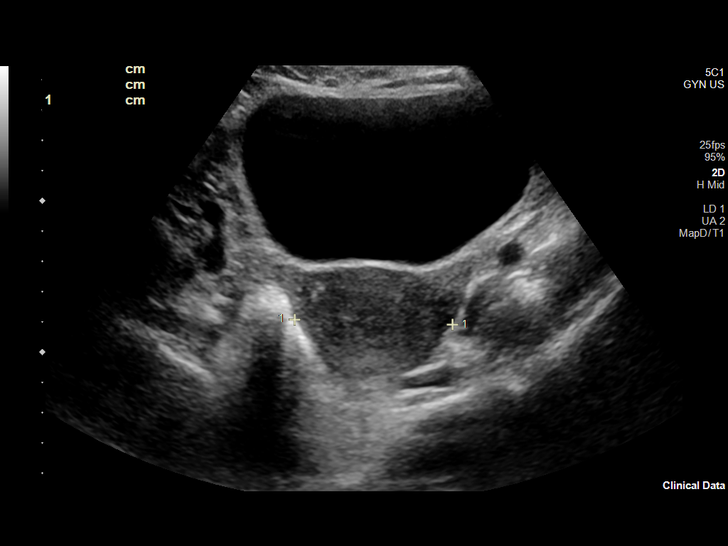
[im 10/56]
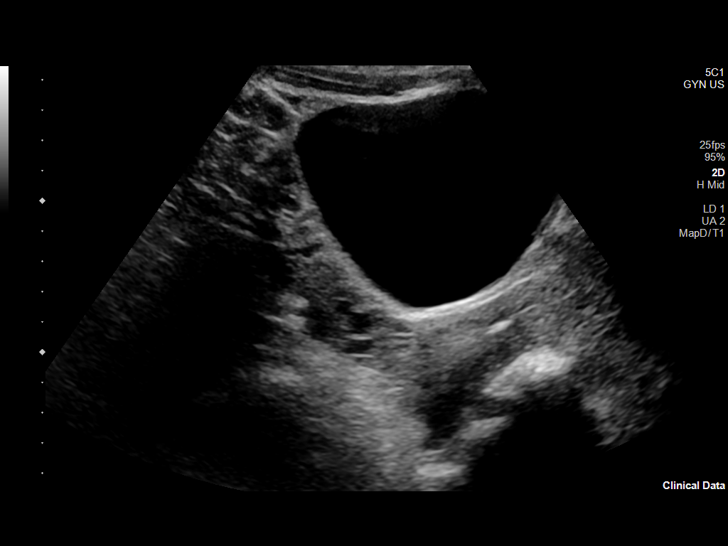
[im 14/56]
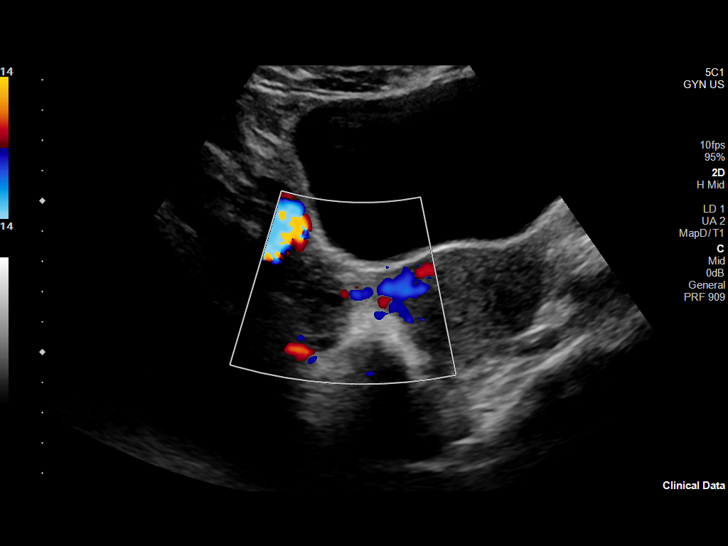
[im 19/56]
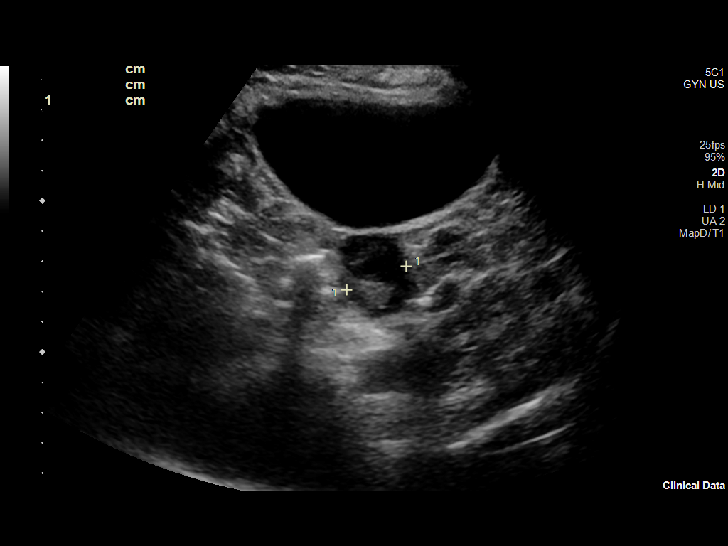
[im 21/56]
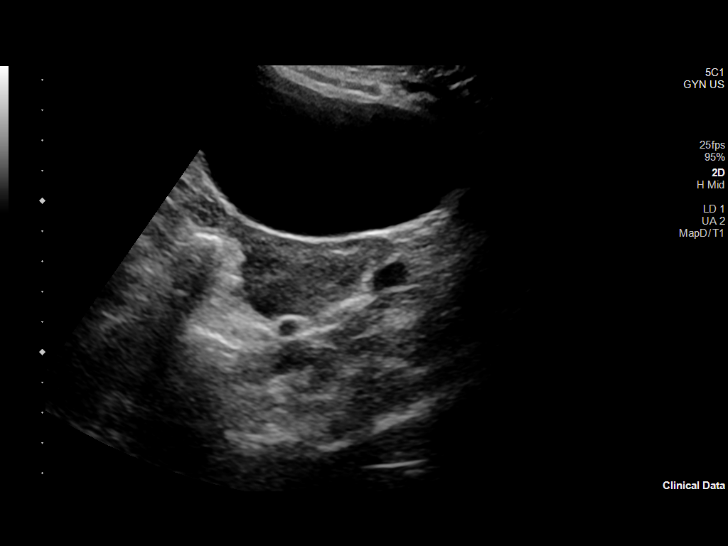
[im 26/56]
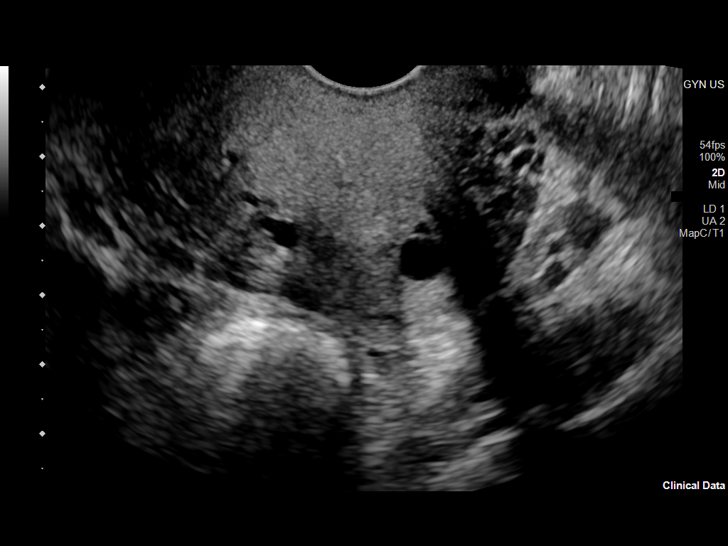
[im 30/56]
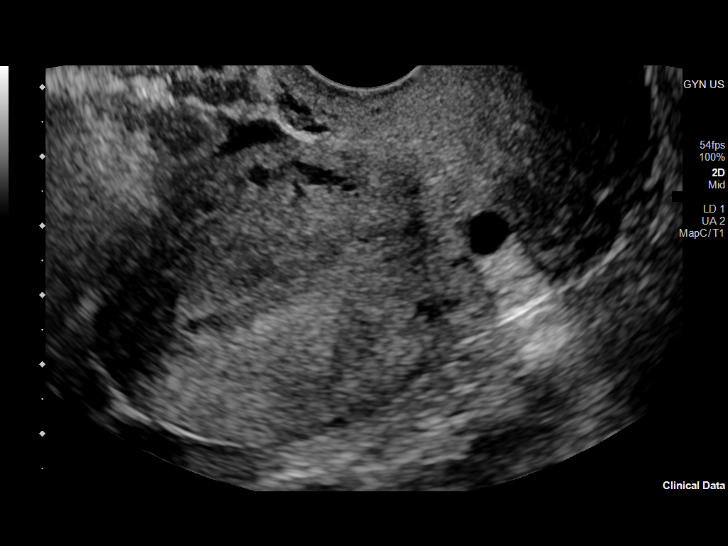
[im 35/56]
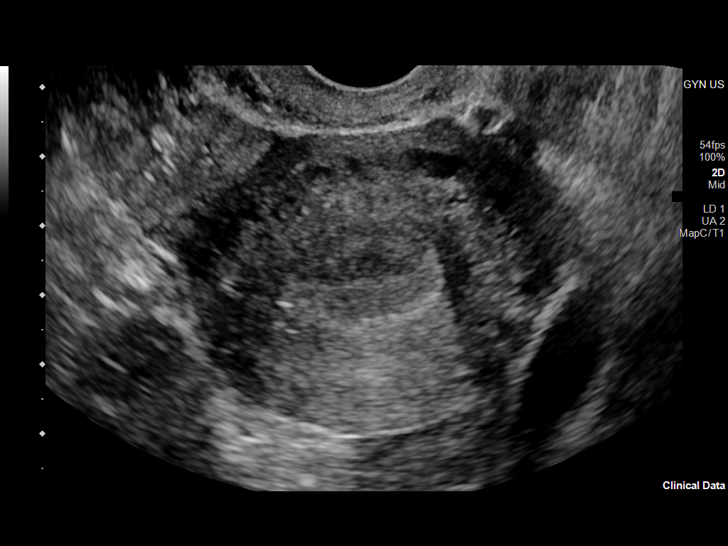
[im 37/56]
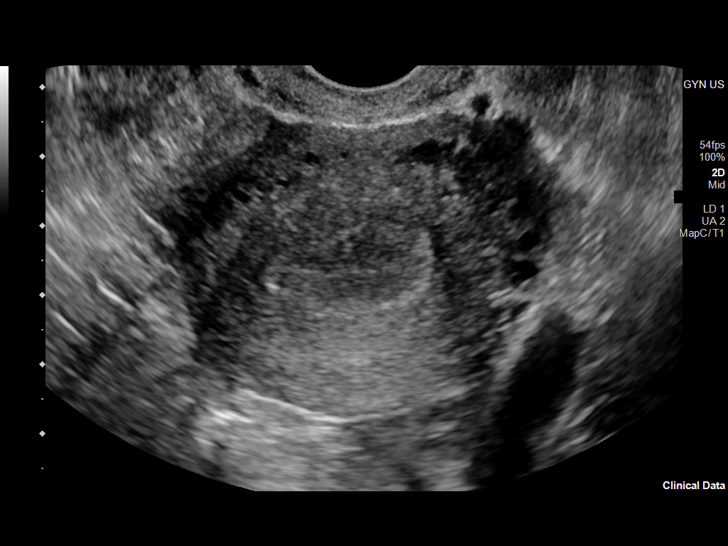
[im 42/56]
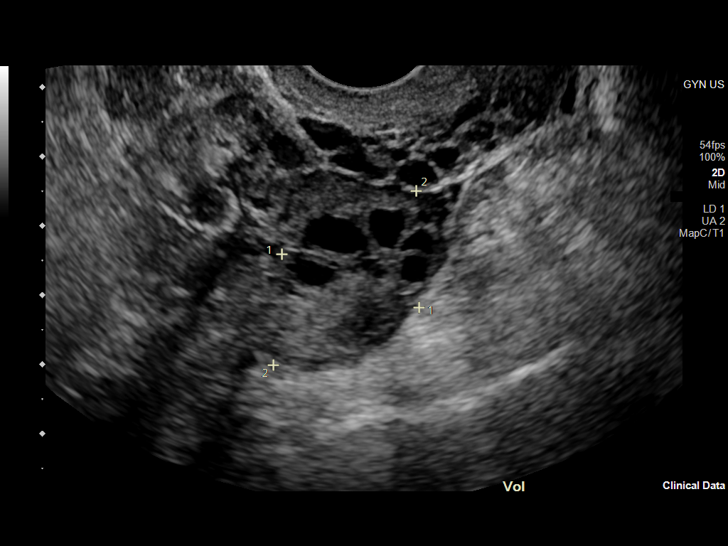
[im 46/56]
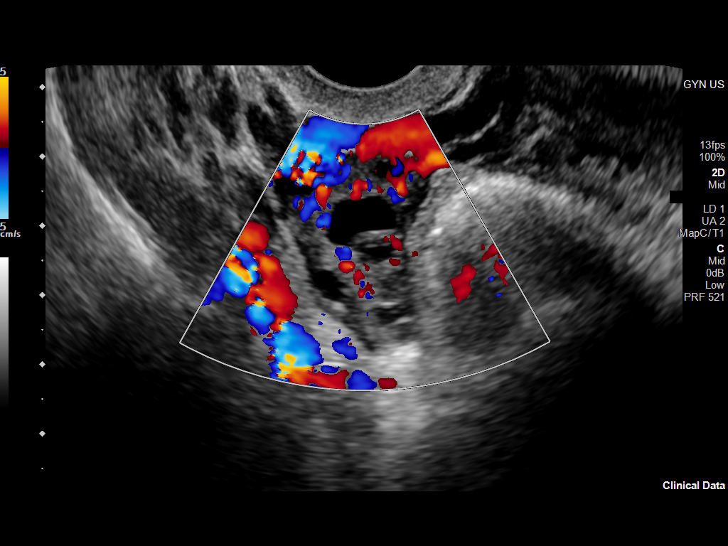
[im 51/56]
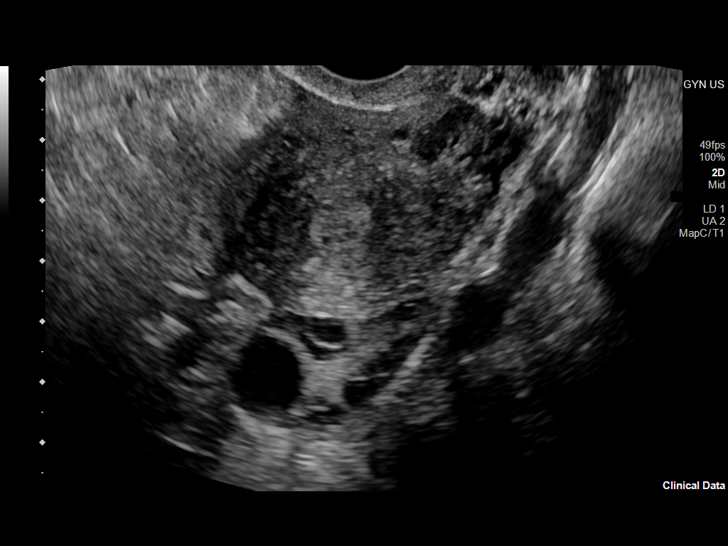
[im 56/56]
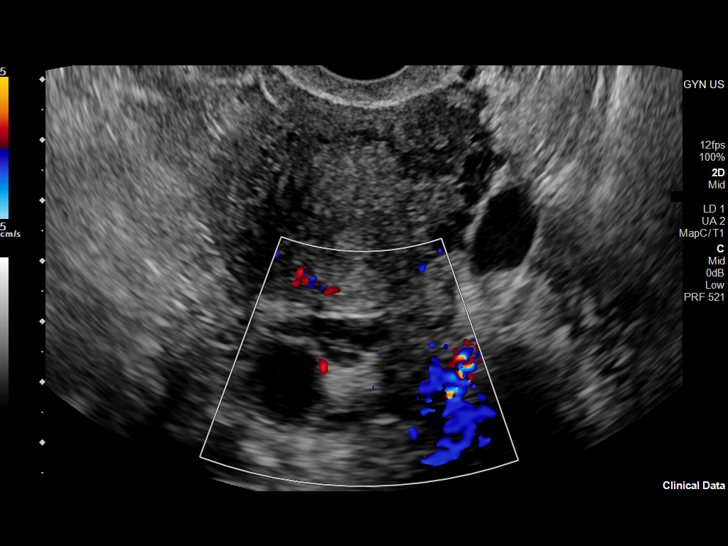

[14 of 25 positions shown; findings below may reference images not displayed]

FINDINGS: Uterus

Measurements: 8.4 x 4.1 x 4.6 cm = volume: 81 mL. Anteverted. Normal
morphology without mass. Nabothian cyst at cervix.

Endometrium

Thickness: 12 mm.  No endometrial fluid or mass

Right ovary

Measurements: 2.1 x 3.3 x 2.2 cm = volume: 7.8 mL. Normal morphology
without mass

Left ovary

Measurements: 3.7 x 2.1 x 3.2 cm = volume: 13.0 mL. Normal
morphology without mass

Other findings

Trace free pelvic fluid.  No adnexal masses.
IMPRESSION: No significant pelvic sonographic abnormalities.

## 2022-11-11 ENCOUNTER — Other Ambulatory Visit: Payer: Self-pay | Admitting: Internal Medicine

## 2022-11-11 DIAGNOSIS — Z1231 Encounter for screening mammogram for malignant neoplasm of breast: Secondary | ICD-10-CM

## 2022-12-31 ENCOUNTER — Ambulatory Visit
Admission: RE | Admit: 2022-12-31 | Discharge: 2022-12-31 | Disposition: A | Discharge status: Home / Self Care | Payer: Medicaid Other | Source: Ambulatory Visit | Attending: Internal Medicine | Admitting: Internal Medicine

## 2022-12-31 DIAGNOSIS — Z1231 Encounter for screening mammogram for malignant neoplasm of breast: Secondary | ICD-10-CM

## 2023-02-02 ENCOUNTER — Ambulatory Visit: Payer: Medicaid Other | Admitting: Obstetrics & Gynecology

## 2023-03-16 ENCOUNTER — Other Ambulatory Visit (HOSPITAL_COMMUNITY)
Admission: RE | Admit: 2023-03-16 | Discharge: 2023-03-16 | Disposition: A | Discharge status: Home / Self Care | Payer: Medicaid Other | Source: Ambulatory Visit | Attending: Obstetrics & Gynecology | Admitting: Obstetrics & Gynecology

## 2023-03-16 ENCOUNTER — Ambulatory Visit (INDEPENDENT_AMBULATORY_CARE_PROVIDER_SITE_OTHER): Payer: Medicaid Other | Admitting: Obstetrics & Gynecology

## 2023-03-16 ENCOUNTER — Encounter: Payer: Self-pay | Admitting: Obstetrics & Gynecology

## 2023-03-16 VITALS — BP 136/83 | HR 70 | Ht 62.0 in | Wt 184.0 lb

## 2023-03-16 DIAGNOSIS — Z1339 Encounter for screening examination for other mental health and behavioral disorders: Secondary | ICD-10-CM | POA: Diagnosis not present

## 2023-03-16 DIAGNOSIS — Z3202 Encounter for pregnancy test, result negative: Secondary | ICD-10-CM | POA: Diagnosis not present

## 2023-03-16 DIAGNOSIS — N926 Irregular menstruation, unspecified: Secondary | ICD-10-CM | POA: Diagnosis not present

## 2023-03-16 DIAGNOSIS — Z01419 Encounter for gynecological examination (general) (routine) without abnormal findings: Secondary | ICD-10-CM

## 2023-03-16 LAB — POCT URINE PREGNANCY: Preg Test, Ur: NEGATIVE

## 2023-03-16 NOTE — Progress Notes (Signed)
Subjective:     Denise Pitts is a 42 y.o. female here for a routine exam.  Current complaints: Pt has 1 episode of an irreg cycle last month. She had one prior irreg cycle 2 years ago.  She denies any chance of an STI. She denies pain or assoc sx.      Gynecologic History Patient's last menstrual period was 02/23/2023 (exact date). Contraception: tubal ligation Last Pap: 03/12/2020. Results were: normal Last mammogram: 12/31/2022. Results were: normal  Obstetric History OB History  Gravida Para Term Preterm AB Living  4 3 0 3 1 3   SAB IAB Ectopic Multiple Live Births    1     3    # Outcome Date GA Lbr Len/2nd Weight Sex Type Anes PTL Lv  4 Preterm 2010 [redacted]w[redacted]d    Vag-Spont   LIV  3 Preterm 2004 [redacted]w[redacted]d    Vag-Spont   LIV  2 Preterm 2003 [redacted]w[redacted]d    Vag-Spont   LIV  1 IAB              The following portions of the patient's history were reviewed and updated as appropriate: allergies, current medications, past family history, past medical history, past social history, past surgical history, and problem list.  Review of Systems Pertinent items are noted in HPI.    Objective:  BP 136/83   Pulse 70   Ht 5\' 2"  (1.575 m)   Wt 184 lb (83.5 kg)   LMP 02/23/2023 (Exact Date)   BMI 33.65 kg/m   General Appearance:    Alert, cooperative, no distress, appears stated age  Head:    Normocephalic, without obvious abnormality, atraumatic  Eyes:    conjunctiva/corneas clear, EOM's intact, both eyes  Ears:    Normal external ear canals, both ears  Nose:   Nares normal, septum midline, mucosa normal, no drainage    or sinus tenderness  Throat:   Lips, mucosa, and tongue normal; teeth and gums normal  Neck:   Supple, symmetrical, trachea midline, no adenopathy;    thyroid:  no enlargement/tenderness/nodules  Back:     Symmetric, no curvature, ROM normal, no CVA tenderness  Lungs:     respirations unlabored  Chest Wall:    No tenderness or deformity   Heart:    Regular rate and rhythm   Breast Exam:    No tenderness, masses, or nipple abnormality  Abdomen:     Soft, non-tender, bowel sounds active all four quadrants,    no masses, no organomegaly  Genitalia:    Normal female without lesion, discharge or tenderness     Extremities:   Extremities normal, atraumatic, no cyanosis or edema  Pulses:   2+ and symmetric all extremities  Skin:   Skin color, texture, turgor normal, no rashes or lesions     Assessment:    Healthy female exam.   Reviewed options for irreg cycles.   Plan:   Diagnoses and all orders for this visit:  Well woman exam with routine gynecological exam  Irregular menses   Rec f/u for TV US and endo bx if AUB persists  Emaley Applin L. Harraway-Smith, M.D., [place]

## 2023-03-21 LAB — CYTOLOGY - PAP
Comment: NEGATIVE
Diagnosis: NEGATIVE
High risk HPV: NEGATIVE

## 2023-06-16 ENCOUNTER — Encounter: Payer: Self-pay | Admitting: Gastroenterology

## 2023-07-25 ENCOUNTER — Encounter: Payer: Self-pay | Admitting: Gastroenterology

## 2023-07-29 ENCOUNTER — Ambulatory Visit: Payer: Medicaid Other | Admitting: Gastroenterology

## 2023-09-15 ENCOUNTER — Ambulatory Visit: Admitting: Gastroenterology

## 2024-02-08 ENCOUNTER — Other Ambulatory Visit: Payer: Self-pay | Admitting: Internal Medicine

## 2024-02-08 DIAGNOSIS — Z1231 Encounter for screening mammogram for malignant neoplasm of breast: Secondary | ICD-10-CM

## 2024-02-28 ENCOUNTER — Ambulatory Visit
Admission: RE | Admit: 2024-02-28 | Discharge: 2024-02-28 | Disposition: A | Discharge status: Home / Self Care | Source: Ambulatory Visit | Attending: Internal Medicine | Admitting: Internal Medicine

## 2024-02-28 DIAGNOSIS — Z1231 Encounter for screening mammogram for malignant neoplasm of breast: Secondary | ICD-10-CM

## 2024-03-23 ENCOUNTER — Other Ambulatory Visit (HOSPITAL_COMMUNITY)
Admission: RE | Admit: 2024-03-23 | Discharge: 2024-03-23 | Disposition: A | Source: Ambulatory Visit | Attending: Obstetrics and Gynecology | Admitting: Obstetrics and Gynecology

## 2024-03-23 ENCOUNTER — Ambulatory Visit (INDEPENDENT_AMBULATORY_CARE_PROVIDER_SITE_OTHER): Admitting: Podiatry

## 2024-03-23 ENCOUNTER — Ambulatory Visit: Admitting: Obstetrics and Gynecology

## 2024-03-23 VITALS — BP 142/95 | HR 78 | Ht 62.0 in | Wt 180.0 lb

## 2024-03-23 DIAGNOSIS — Z01419 Encounter for gynecological examination (general) (routine) without abnormal findings: Secondary | ICD-10-CM

## 2024-03-23 DIAGNOSIS — Z113 Encounter for screening for infections with a predominantly sexual mode of transmission: Secondary | ICD-10-CM | POA: Diagnosis present

## 2024-03-23 DIAGNOSIS — Z79899 Other long term (current) drug therapy: Secondary | ICD-10-CM | POA: Diagnosis not present

## 2024-03-23 DIAGNOSIS — B351 Tinea unguium: Secondary | ICD-10-CM

## 2024-03-23 DIAGNOSIS — L989 Disorder of the skin and subcutaneous tissue, unspecified: Secondary | ICD-10-CM | POA: Diagnosis not present

## 2024-03-23 DIAGNOSIS — M216X2 Other acquired deformities of left foot: Secondary | ICD-10-CM | POA: Diagnosis not present

## 2024-03-23 DIAGNOSIS — M216X1 Other acquired deformities of right foot: Secondary | ICD-10-CM | POA: Diagnosis not present

## 2024-03-23 NOTE — Progress Notes (Signed)
 Patient presents for Annual.  LMP: Patient's last menstrual period was 02/24/2024 (exact date).  Last pap: Date: 03/16/2023 Contraception: None Mammogram: Up to date: 02/28/2024 STD Screening: Declines Flu Vaccine : Declines  CC: Annual/None  Fun Fact:

## 2024-03-23 NOTE — Progress Notes (Signed)
 Subjective:  Patient ID: Denise Pitts, female    DOB: 1980-06-20,  MRN: [id number]  Chief Complaint  Patient presents with   Nail Problem    5th toe nail is dark pt stated that her feet are always dry and they itch she stated that she has stuff between her toes     43 y.o. female presents with the above complaint.  Patient presents right fifth digit thickened onychodystrophy mycotic nail x 1.  She wanted to get it evaluated she says also ambulation.  She states has been present for quite some time she has also has interdigital space athlete's foot she wanted discuss treatment options for that as well.  She does not want any orthotics either.  She would like to discuss treatment options for that as well   Review of Systems: Negative except as noted in the HPI. Denies N/V/F/Ch.  Past Medical History:  Diagnosis Date   External hemorrhoid    Obesity     Current Outpatient Medications:    clotrimazole-betamethasone (LOTRISONE) cream, SMARTSIG:0.5 Inch(es) Topical Twice Daily, Disp: , Rfl:    fluconazole (DIFLUCAN) 150 MG tablet, Take 150 mg by mouth daily., Disp: , Rfl:    traMADol (ULTRAM) 50 MG tablet, Take 50 mg by mouth 4 (four) times daily as needed., Disp: , Rfl:    Boric Acid CRYS, Place 600 mg vaginally at bedtime. Use vaginally every night for two weeks then twice a week for six months (Patient not taking: Reported on 12/16/2021), Disp: 500 g, Rfl: 5   metroNIDAZOLE  (FLAGYL ) 500 MG tablet, Take 1 tablet (500 mg total) by mouth 2 (two) times daily. (Patient not taking: Reported on 02/22/2022), Disp: 14 tablet, Rfl: 0   metroNIDAZOLE  (METROGEL ) 0.75 % vaginal gel, Place 1 Applicatorful vaginally at bedtime. Apply one applicatorful to vagina at bedtime for 10 days, then twice a week for 6 months. (Patient not taking: Reported on 02/22/2022), Disp: 70 g, Rfl: 5   Multiple Vitamin (MULTIVITAMIN) tablet, Take 1 tablet by mouth daily., Disp: , Rfl:    omeprazole (PRILOSEC) 40 MG capsule,  Take 40 mg by mouth daily., Disp: , Rfl:   Social History   Tobacco Use  Smoking Status Never  Smokeless Tobacco Never    No Known Allergies Objective:  There were no vitals filed for this visit. There is no height or weight on file to calculate BMI. Constitutional Well developed. Well nourished.  Vascular Dorsalis pedis pulses palpable bilaterally. Posterior tibial pulses palpable bilaterally. Capillary refill normal to all digits.  No cyanosis or clubbing noted. Pedal hair growth normal.  Neurologic Normal speech. Oriented to person, place, and time. Epicritic sensation to light touch grossly present bilaterally.  Dermatologic Nails thickened onychodystrophy mycotic nail x 1 right fifth digit. Skin within normal limits  Orthopedic: Normal joint ROM without pain or crepitus bilaterally. No visible deformities. No bony tenderness.   Radiographs: None Assessment:   1. Long-term use of high-risk medication   2. Nail fungus   3. Onychomycosis due to dermatophyte   4. Other acquired deformities of left foot   5. Other acquired deformities of right foot    Plan:  Patient was evaluated and treated and all questions answered.  Right fifth digit onychomycosis -All questions and concerns were discussed with the patient in extensive detail -Educated the patient on the etiology of onychomycosis and various treatment options associated with improving the fungal load.  I explained to the patient that there is 3 treatment options available to  treat the onychomycosis including topical, p.o., laser treatment.  Patient elected to undergo p.o. options with Lamisil /terbinafine  therapy.  In order for me to start the medication therapy, I explained to the patient the importance of evaluating the liver and obtaining the liver function test.  Once the liver function test comes back normal I will start him on 73-month course of Lamisil  therapy.  Patient understood all risk and would like to proceed  with Lamisil  therapy.  I have asked the patient to immediately stop the Lamisil  therapy if she has any reactions to it and call the office or go to the emergency room right away.  Patient states understanding   Pes planovalgus/foot deformity -I explained to patient the etiology of pes planovalgus and relationship with heel pain/arch pain and various treatment options were discussed.  Given patient foot structure in the setting of heel pain/arch pain I believe patient will benefit from custom-made orthotics to help control the hindfoot motion support the arch of the foot and take the stress away from arches.  Patient agrees with the plan like to proceed with orthotics -Patient was casted for orthotics   No follow-ups on file.

## 2024-03-23 NOTE — Progress Notes (Addendum)
 ANNUAL GYNECOLOGY VISIT Chief Complaint  Patient presents with   Gynecologic Exam    Annual with pap      Subjective:  Denise Pitts is a 43 y.o. [phone] who presents for annual exam.  Reports multiple black heads on both her breasts and wants to know what to do about it. Just had normal mammogram No lumps noted   Gyn History: Patient's last menstrual period was 02/24/2024 (exact date). Sexually active: yes/no: Yes Contraception: bilateral tubal ligation History of STIs: No Last pap:  Lab Results  Component Value Date   DIAGPAP  03/16/2023    - Negative for intraepithelial lesion or malignancy (NILM)   HPVHIGH Negative 03/16/2023   History of abnormal pap: No Periods: regular Last mammogram: 02/2024    The pregnancy intention screening data noted above was reviewed. Potential methods of contraception were discussed. The patient elected to proceed with No data recorded.       03/23/2024    9:53 AM 03/16/2023    3:08 PM  Depression screen PHQ 2/9  Decreased Interest 0 0  Down, Depressed, Hopeless 0 1  PHQ - 2 Score 0 1  Altered sleeping 0 0  Tired, decreased energy 0 1  Change in appetite 0 0  Feeling bad or failure about yourself  0 0  Trouble concentrating 0 0  Moving slowly or fidgety/restless 0 0  Suicidal thoughts 0 0  PHQ-9 Score 0 2      Data saved with a previous flowsheet row definition        03/23/2024    9:53 AM 03/16/2023    3:08 PM  GAD 7 : Generalized Anxiety Score  Nervous, Anxious, on Edge 0 0  Control/stop worrying 0 0  Worry too much - different things 0 1  Trouble relaxing 0 0  Restless 0 0  Easily annoyed or irritable 0 1  Afraid - awful might happen 0 0  Total GAD 7 Score 0 2      OB History     Gravida  4   Para  3   Term  0   Preterm  3   AB  1   Living  3      SAB      IAB  1   Ectopic      Multiple      Live Births  3           Past Medical History:  Diagnosis Date   External  hemorrhoid    Obesity     Past Surgical History:  Procedure Laterality Date   TUBAL LIGATION      Social History   Socioeconomic History   Marital status: Single    Spouse name: Not on file   Number of children: Not on file   Years of education: Not on file   Highest education level: Not on file  Occupational History   Not on file  Tobacco Use   Smoking status: Never   Smokeless tobacco: Never  Vaping Use   Vaping status: Never Used  Substance and Sexual Activity   Alcohol use: No    Comment: 2 drinks in a month   Drug use: No   Sexual activity: Yes    Birth control/protection: Surgical  Other Topics Concern   Not on file  Social History Narrative   Not on file   Social Drivers of Health   Financial Resource Strain: Not on file  Food Insecurity: Not on  file  Transportation Needs: Not on file  Physical Activity: Not on file  Stress: Not on file  Social Connections: Not on file    Family History  Problem Relation Age of Onset   Cancer Neg Hx    Diabetes Neg Hx    Breast cancer Neg Hx     Current Outpatient Medications on File Prior to Visit  Medication Sig Dispense Refill   Multiple Vitamin (MULTIVITAMIN) tablet Take 1 tablet by mouth daily.     No current facility-administered medications on file prior to visit.    No Known Allergies   Objective:   Vitals:   03/23/24 0926 03/23/24 0934  BP: (!) 145/95 (!) 142/95  Pulse: 78 78  Weight: 180 lb (81.6 kg)   Height: 5' 2 (1.575 m)    Physical Examination:   General appearance - well appearing, and in no distress  Mental status - alert, oriented to person, place, and time  Psych:  normal mood and affect  Skin - warm and dry, normal color, no suspicious lesions noted  Chest - effort normal, all lung fields clear to auscultation bilaterally  Heart - normal rate and regular rhythm  Neck:  midline trachea, no thyromegaly or nodules  Breasts - breasts appear normal, no suspicious masses, multiple  black heads throughout both breasts  Abdomen - soft, nontender, nondistended, no masses or organomegaly  Pelvic -  VULVA: normal appearing vulva with no masses, tenderness or lesions   VAGINA: normal appearing vagina with normal color and discharge, no lesions   CERVIX: normal appearing cervix without discharge or lesions, no CMT  UTERUS: uterus is felt to be normal size, shape, consistency and nontender   ADNEXA: No adnexal masses or tenderness noted.  Extremities:  No swelling or varicosities noted  Chaperone present for exam  Assessment and Plan:  1. Well woman exam with routine gynecological exam (Primary) Pap/HPV up to date Mammo up to date STI screening Tubal for contraception  2. Routine screening for STI (sexually transmitted infection) - Cervicovaginal ancillary only - RPR+HBsAg+HCVAb+...  3. Skin lesions Recommend derm evaluation, patient has derm and plans to f/u. Will let me know what they say   No follow-ups on file.  No future appointments.  Rollo ONEIDA Bring, MD, FACOG Obstetrician & Gynecologist, [facility] for [facility], [facility]

## 2024-03-24 LAB — RPR+HBSAG+HCVAB+...
HIV Screen 4th Generation wRfx: NONREACTIVE
Hep C Virus Ab: NONREACTIVE
Hepatitis B Surface Ag: NEGATIVE
RPR Ser Ql: NONREACTIVE

## 2024-03-26 ENCOUNTER — Ambulatory Visit: Payer: Self-pay | Admitting: Obstetrics and Gynecology

## 2024-03-26 LAB — CERVICOVAGINAL ANCILLARY ONLY
Chlamydia: NEGATIVE
Comment: NEGATIVE
Comment: NEGATIVE
Comment: NORMAL
Neisseria Gonorrhea: NEGATIVE
Trichomonas: NEGATIVE

## 2024-03-27 LAB — HEPATIC FUNCTION PANEL
AG Ratio: 1.5 (calc) (ref 1.0–2.5)
ALT: 9 U/L (ref 6–29)
AST: 15 U/L (ref 10–30)
Albumin: 4 g/dL (ref 3.6–5.1)
Alkaline phosphatase (APISO): 44 U/L (ref 31–125)
Bilirubin, Direct: 0.1 mg/dL (ref 0.0–0.2)
Globulin: 2.6 g/dL (ref 1.9–3.7)
Indirect Bilirubin: 0.2 mg/dL (ref 0.2–1.2)
Total Bilirubin: 0.3 mg/dL (ref 0.2–1.2)
Total Protein: 6.6 g/dL (ref 6.1–8.1)

## 2024-03-27 MED ORDER — TERBINAFINE HCL 250 MG PO TABS: 250.0000 mg | ORAL_TABLET | Freq: Every day | ORAL | 0 refills | Status: AC

## 2024-03-27 NOTE — Addendum Note (Signed)
 Addended by: Gevork Ayyad on: 03/27/2024 08:18 AM   Modules accepted: Orders

## 2024-04-02 ENCOUNTER — Encounter: Payer: Self-pay | Admitting: Podiatry

## 2024-04-30 ENCOUNTER — Telehealth: Payer: Self-pay

## 2024-04-30 NOTE — Telephone Encounter (Signed)
 12/29 Orthotics are in the office.  LVM to schedule an appt for pick up

## 2024-05-16 ENCOUNTER — Other Ambulatory Visit
